# Patient Record
Sex: Female | Born: 1970
Health system: Southern US, Community
[De-identification: ages and names within clinical notes are randomized; demographics above are authoritative.]

## PROBLEM LIST (undated history)

## (undated) DIAGNOSIS — K529 Noninfective gastroenteritis and colitis, unspecified: Secondary | ICD-10-CM

## (undated) DIAGNOSIS — C801 Malignant (primary) neoplasm, unspecified: Secondary | ICD-10-CM

## (undated) HISTORY — PX: OTHER SURGICAL HISTORY: SHX169

---
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
## 2017-03-24 DIAGNOSIS — C801 Malignant (primary) neoplasm, unspecified: Secondary | ICD-10-CM

## 2017-03-24 HISTORY — DX: Malignant (primary) neoplasm, unspecified: C80.1

## 2017-03-24 HISTORY — PX: GASTRECTOMY: SHX58

## 2017-07-27 DIAGNOSIS — B9681 Helicobacter pylori [H. pylori] as the cause of diseases classified elsewhere: Secondary | ICD-10-CM | POA: Insufficient documentation

## 2017-07-27 DIAGNOSIS — Z800 Family history of malignant neoplasm of digestive organs: Secondary | ICD-10-CM | POA: Insufficient documentation

## 2017-07-27 DIAGNOSIS — C169 Malignant neoplasm of stomach, unspecified: Secondary | ICD-10-CM | POA: Insufficient documentation

## 2017-07-27 DIAGNOSIS — K2270 Barrett's esophagus without dysplasia: Secondary | ICD-10-CM | POA: Insufficient documentation

## 2017-07-27 DIAGNOSIS — K2970 Gastritis, unspecified, without bleeding: Secondary | ICD-10-CM | POA: Insufficient documentation

## 2017-07-27 DIAGNOSIS — R1013 Epigastric pain: Secondary | ICD-10-CM | POA: Insufficient documentation

## 2017-08-13 DIAGNOSIS — F17201 Nicotine dependence, unspecified, in remission: Secondary | ICD-10-CM | POA: Insufficient documentation

## 2017-09-08 DIAGNOSIS — D509 Iron deficiency anemia, unspecified: Secondary | ICD-10-CM | POA: Insufficient documentation

## 2017-12-08 DIAGNOSIS — Z85028 Personal history of other malignant neoplasm of stomach: Secondary | ICD-10-CM | POA: Insufficient documentation

## 2017-12-08 DIAGNOSIS — Z08 Encounter for follow-up examination after completed treatment for malignant neoplasm: Secondary | ICD-10-CM | POA: Insufficient documentation

## 2018-01-01 DIAGNOSIS — R636 Underweight: Secondary | ICD-10-CM | POA: Insufficient documentation

## 2018-02-26 DIAGNOSIS — R1084 Generalized abdominal pain: Secondary | ICD-10-CM | POA: Insufficient documentation

## 2018-02-26 DIAGNOSIS — R109 Unspecified abdominal pain: Secondary | ICD-10-CM | POA: Insufficient documentation

## 2018-02-26 DIAGNOSIS — K5900 Constipation, unspecified: Secondary | ICD-10-CM | POA: Insufficient documentation

## 2018-02-26 DIAGNOSIS — R1032 Left lower quadrant pain: Secondary | ICD-10-CM | POA: Insufficient documentation

## 2018-03-02 DIAGNOSIS — N838 Other noninflammatory disorders of ovary, fallopian tube and broad ligament: Secondary | ICD-10-CM | POA: Insufficient documentation

## 2018-05-14 DIAGNOSIS — R1084 Generalized abdominal pain: Secondary | ICD-10-CM | POA: Diagnosis not present

## 2018-05-14 DIAGNOSIS — R935 Abnormal findings on diagnostic imaging of other abdominal regions, including retroperitoneum: Secondary | ICD-10-CM | POA: Diagnosis not present

## 2018-05-14 DIAGNOSIS — N83209 Unspecified ovarian cyst, unspecified side: Secondary | ICD-10-CM | POA: Diagnosis not present

## 2018-05-14 DIAGNOSIS — L659 Nonscarring hair loss, unspecified: Secondary | ICD-10-CM | POA: Diagnosis not present

## 2018-05-14 DIAGNOSIS — L989 Disorder of the skin and subcutaneous tissue, unspecified: Secondary | ICD-10-CM | POA: Diagnosis not present

## 2018-05-14 DIAGNOSIS — K5900 Constipation, unspecified: Secondary | ICD-10-CM | POA: Diagnosis not present

## 2018-05-14 DIAGNOSIS — R110 Nausea: Secondary | ICD-10-CM | POA: Diagnosis not present

## 2018-05-26 DIAGNOSIS — L089 Local infection of the skin and subcutaneous tissue, unspecified: Secondary | ICD-10-CM | POA: Diagnosis not present

## 2018-05-26 DIAGNOSIS — L910 Hypertrophic scar: Secondary | ICD-10-CM | POA: Diagnosis not present

## 2018-05-26 DIAGNOSIS — R6889 Other general symptoms and signs: Secondary | ICD-10-CM | POA: Diagnosis not present

## 2018-05-26 DIAGNOSIS — L720 Epidermal cyst: Secondary | ICD-10-CM | POA: Diagnosis not present

## 2018-05-26 DIAGNOSIS — B001 Herpesviral vesicular dermatitis: Secondary | ICD-10-CM | POA: Diagnosis not present

## 2018-05-27 DIAGNOSIS — D251 Intramural leiomyoma of uterus: Secondary | ICD-10-CM | POA: Diagnosis not present

## 2018-06-07 DIAGNOSIS — I700 Atherosclerosis of aorta: Secondary | ICD-10-CM | POA: Diagnosis not present

## 2018-06-07 DIAGNOSIS — C162 Malignant neoplasm of body of stomach: Secondary | ICD-10-CM | POA: Diagnosis not present

## 2018-06-07 DIAGNOSIS — C169 Malignant neoplasm of stomach, unspecified: Secondary | ICD-10-CM | POA: Diagnosis not present

## 2018-06-07 DIAGNOSIS — R918 Other nonspecific abnormal finding of lung field: Secondary | ICD-10-CM | POA: Diagnosis not present

## 2018-06-08 DIAGNOSIS — C163 Malignant neoplasm of pyloric antrum: Secondary | ICD-10-CM | POA: Diagnosis not present

## 2018-06-08 DIAGNOSIS — Z903 Acquired absence of stomach [part of]: Secondary | ICD-10-CM | POA: Diagnosis not present

## 2018-06-08 DIAGNOSIS — D649 Anemia, unspecified: Secondary | ICD-10-CM | POA: Diagnosis not present

## 2018-06-08 DIAGNOSIS — C162 Malignant neoplasm of body of stomach: Secondary | ICD-10-CM | POA: Diagnosis not present

## 2018-06-08 DIAGNOSIS — Z08 Encounter for follow-up examination after completed treatment for malignant neoplasm: Secondary | ICD-10-CM | POA: Diagnosis not present

## 2018-06-08 DIAGNOSIS — Z85028 Personal history of other malignant neoplasm of stomach: Secondary | ICD-10-CM | POA: Diagnosis not present

## 2018-06-08 DIAGNOSIS — R1032 Left lower quadrant pain: Secondary | ICD-10-CM | POA: Diagnosis not present

## 2018-06-08 DIAGNOSIS — G4700 Insomnia, unspecified: Secondary | ICD-10-CM | POA: Diagnosis not present

## 2018-06-08 MED FILL — TEMAZEPAM 15 MG CAPSULE: 15 | 30 days supply | Qty: 30 | Fill #0

## 2018-06-09 MED FILL — CYANOCOBALAMIN 1,000 MCG/ML: 1000 | 90 days supply | Qty: 3 | Fill #0

## 2018-06-09 MED FILL — AMITRIPTYLINE HCL 25 MG TAB: 25 | 90 days supply | Qty: 90 | Fill #0

## 2018-06-11 DIAGNOSIS — R928 Other abnormal and inconclusive findings on diagnostic imaging of breast: Secondary | ICD-10-CM | POA: Diagnosis not present

## 2018-06-11 MED FILL — BISACODYL 10 MG SUPP: 10 | 12 days supply | Qty: 12 | Fill #0

## 2018-06-16 DIAGNOSIS — F419 Anxiety disorder, unspecified: Secondary | ICD-10-CM | POA: Diagnosis not present

## 2018-06-16 DIAGNOSIS — G4700 Insomnia, unspecified: Secondary | ICD-10-CM | POA: Diagnosis not present

## 2018-06-16 DIAGNOSIS — K5900 Constipation, unspecified: Secondary | ICD-10-CM | POA: Diagnosis not present

## 2018-06-16 DIAGNOSIS — R109 Unspecified abdominal pain: Secondary | ICD-10-CM | POA: Diagnosis not present

## 2018-06-16 MED FILL — PARoxetine HCL 20 MG TABS: 20 | 30 days supply | Qty: 30 | Fill #0

## 2018-06-16 MED FILL — BD LUER-LOK SYR 3 ML 25GX5/: 25G X 5/8" | 365 days supply | Qty: 12 | Fill #0

## 2018-07-10 MED FILL — PARoxetine HCL 20 MG TABS: 20 | 30 days supply | Qty: 30 | Fill #1

## 2018-08-04 DIAGNOSIS — G4700 Insomnia, unspecified: Secondary | ICD-10-CM | POA: Diagnosis not present

## 2018-08-04 DIAGNOSIS — F419 Anxiety disorder, unspecified: Secondary | ICD-10-CM | POA: Diagnosis not present

## 2018-08-04 DIAGNOSIS — K5900 Constipation, unspecified: Secondary | ICD-10-CM | POA: Diagnosis not present

## 2018-08-04 MED FILL — TEMAZEPAM 15 MG CAPSULE: 15 | 30 days supply | Qty: 30 | Fill #0

## 2018-08-04 MED FILL — PARoxetine HCL 40 MG TABS: 40 | 30 days supply | Qty: 30 | Fill #0

## 2018-08-06 MED FILL — BISACODYL 10 MG SUPP: 10 | 12 days supply | Qty: 12 | Fill #0

## 2018-08-10 MED FILL — CLINDAMYCIN HCL 300 MG CAPS: 300 | 10 days supply | Qty: 30 | Fill #0

## 2018-08-12 DIAGNOSIS — L03012 Cellulitis of left finger: Secondary | ICD-10-CM | POA: Diagnosis not present

## 2018-08-12 DIAGNOSIS — E538 Deficiency of other specified B group vitamins: Secondary | ICD-10-CM | POA: Diagnosis not present

## 2018-08-12 DIAGNOSIS — Z23 Encounter for immunization: Secondary | ICD-10-CM | POA: Diagnosis not present

## 2018-08-12 MED FILL — MUPIROCIN 2% OINTMENT: 2 | 7 days supply | Qty: 22 | Fill #0

## 2018-08-25 DIAGNOSIS — Z85028 Personal history of other malignant neoplasm of stomach: Secondary | ICD-10-CM | POA: Diagnosis not present

## 2018-08-25 DIAGNOSIS — R112 Nausea with vomiting, unspecified: Secondary | ICD-10-CM | POA: Diagnosis not present

## 2018-08-25 DIAGNOSIS — R1084 Generalized abdominal pain: Secondary | ICD-10-CM | POA: Diagnosis not present

## 2018-08-25 DIAGNOSIS — D127 Benign neoplasm of rectosigmoid junction: Secondary | ICD-10-CM | POA: Diagnosis not present

## 2018-08-25 DIAGNOSIS — K9171 Accidental puncture and laceration of a digestive system organ or structure during a digestive system procedure: Secondary | ICD-10-CM | POA: Diagnosis not present

## 2018-08-25 DIAGNOSIS — K3189 Other diseases of stomach and duodenum: Secondary | ICD-10-CM | POA: Diagnosis not present

## 2018-08-25 DIAGNOSIS — K529 Noninfective gastroenteritis and colitis, unspecified: Secondary | ICD-10-CM | POA: Diagnosis not present

## 2018-08-25 DIAGNOSIS — K222 Esophageal obstruction: Secondary | ICD-10-CM | POA: Diagnosis not present

## 2018-08-25 DIAGNOSIS — Z1211 Encounter for screening for malignant neoplasm of colon: Secondary | ICD-10-CM | POA: Diagnosis not present

## 2018-08-25 DIAGNOSIS — K648 Other hemorrhoids: Secondary | ICD-10-CM | POA: Diagnosis not present

## 2018-08-25 DIAGNOSIS — R634 Abnormal weight loss: Secondary | ICD-10-CM | POA: Diagnosis not present

## 2018-09-03 MED FILL — PARoxetine HCL 40 MG TABS: 40 | 30 days supply | Qty: 30 | Fill #1

## 2018-09-03 MED FILL — TEMAZEPAM 15 MG CAPSULE: 15 | 30 days supply | Qty: 30 | Fill #1

## 2018-09-17 ENCOUNTER — Other Ambulatory Visit: Payer: Self-pay

## 2018-09-17 ENCOUNTER — Ambulatory Visit: Payer: Self-pay | Admitting: Family Medicine

## 2018-09-17 DIAGNOSIS — B351 Tinea unguium: Secondary | ICD-10-CM | POA: Diagnosis not present

## 2018-09-17 DIAGNOSIS — L089 Local infection of the skin and subcutaneous tissue, unspecified: Secondary | ICD-10-CM | POA: Diagnosis not present

## 2018-09-17 DIAGNOSIS — L910 Hypertrophic scar: Secondary | ICD-10-CM | POA: Diagnosis not present

## 2018-09-17 DIAGNOSIS — B353 Tinea pedis: Secondary | ICD-10-CM | POA: Diagnosis not present

## 2018-09-17 DIAGNOSIS — L219 Seborrheic dermatitis, unspecified: Secondary | ICD-10-CM | POA: Diagnosis not present

## 2018-09-17 MED FILL — HYDROCORTISONE 2.5% CREAM: 2.5 | 10 days supply | Qty: 30 | Fill #0

## 2018-09-17 MED FILL — TERBINAFINE HCL 250 MG TAB: 250 | 90 days supply | Qty: 90 | Fill #0

## 2018-09-17 MED FILL — SM ATHLETE'S 1% FOOT CREAM: 1 % | 14 days supply | Qty: 30 | Fill #0

## 2018-09-17 MED FILL — KETOCONAZOLE 2% SHAMPOO: 2 | 30 days supply | Qty: 120 | Fill #0

## 2018-09-20 ENCOUNTER — Encounter (HOSPITAL_COMMUNITY): Payer: Self-pay

## 2018-09-20 ENCOUNTER — Other Ambulatory Visit: Payer: Self-pay

## 2018-09-20 ENCOUNTER — Emergency Department (HOSPITAL_COMMUNITY)
Admission: EM | Admit: 2018-09-20 | Discharge: 2018-09-21 | Disposition: A | Discharge status: Home / Self Care | Payer: 59 | Attending: Emergency Medicine | Admitting: Emergency Medicine

## 2018-09-20 DIAGNOSIS — R109 Unspecified abdominal pain: Secondary | ICD-10-CM | POA: Diagnosis not present

## 2018-09-20 DIAGNOSIS — F17210 Nicotine dependence, cigarettes, uncomplicated: Secondary | ICD-10-CM | POA: Diagnosis not present

## 2018-09-20 DIAGNOSIS — R1084 Generalized abdominal pain: Secondary | ICD-10-CM | POA: Diagnosis not present

## 2018-09-20 DIAGNOSIS — Z79899 Other long term (current) drug therapy: Secondary | ICD-10-CM | POA: Insufficient documentation

## 2018-09-20 DIAGNOSIS — K5900 Constipation, unspecified: Secondary | ICD-10-CM | POA: Insufficient documentation

## 2018-09-20 DIAGNOSIS — Z85028 Personal history of other malignant neoplasm of stomach: Secondary | ICD-10-CM | POA: Diagnosis not present

## 2018-09-20 HISTORY — DX: Noninfective gastroenteritis and colitis, unspecified: K52.9

## 2018-09-20 NOTE — ED Notes (Signed)
Bed: SN05 Expected date:  Expected time:  Means of arrival:  Comments: [place]

## 2018-09-20 NOTE — ED Triage Notes (Addendum)
Pt c/o abd pain that began 3 days ago, however, today around 1700 it got worse. Pt describes it as a burning sensation and pain in epigastric region and LLQ. Husband at bedside states that she is 71mo out from complete stomach removal d/t stomach cancer.  Pt has experienced increased weakness over the past several days. Pt cannot stand, only sit in the chair, and crawling on floor to get around. Pt was only able to eat breakfast d/t nausea, small amount of emesis.   Pt has chronic constipation.

## 2018-09-21 ENCOUNTER — Emergency Department (HOSPITAL_COMMUNITY): Payer: 59

## 2018-09-21 ENCOUNTER — Encounter (HOSPITAL_COMMUNITY): Payer: Self-pay

## 2018-09-21 DIAGNOSIS — Z85028 Personal history of other malignant neoplasm of stomach: Secondary | ICD-10-CM | POA: Diagnosis not present

## 2018-09-21 DIAGNOSIS — Z79899 Other long term (current) drug therapy: Secondary | ICD-10-CM | POA: Diagnosis not present

## 2018-09-21 DIAGNOSIS — F17210 Nicotine dependence, cigarettes, uncomplicated: Secondary | ICD-10-CM | POA: Diagnosis not present

## 2018-09-21 DIAGNOSIS — R109 Unspecified abdominal pain: Secondary | ICD-10-CM | POA: Diagnosis not present

## 2018-09-21 DIAGNOSIS — K5900 Constipation, unspecified: Secondary | ICD-10-CM | POA: Diagnosis not present

## 2018-09-21 LAB — CBC WITH DIFFERENTIAL/PLATELET
Abs Immature Granulocytes: 0.02 10*3/uL (ref 0.00–0.07)
Basophils Absolute: 0.1 10*3/uL (ref 0.0–0.1)
Basophils Relative: 1 %
Eosinophils Absolute: 0.2 10*3/uL (ref 0.0–0.5)
Eosinophils Relative: 2 %
HCT: 41.7 % (ref 36.0–46.0)
Hemoglobin: 13.5 g/dL (ref 12.0–15.0)
Immature Granulocytes: 0 %
Lymphocytes Relative: 14 %
Lymphs Abs: 1.6 10*3/uL (ref 0.7–4.0)
MCH: 31.2 pg (ref 26.0–34.0)
MCHC: 32.4 g/dL (ref 30.0–36.0)
MCV: 96.3 fL (ref 80.0–100.0)
Monocytes Absolute: 0.5 10*3/uL (ref 0.1–1.0)
Monocytes Relative: 5 %
Neutro Abs: 8.6 10*3/uL — ABNORMAL HIGH (ref 1.7–7.7)
Neutrophils Relative %: 78 %
Platelets: 352 10*3/uL (ref 150–400)
RBC: 4.33 MIL/uL (ref 3.87–5.11)
RDW: 13.2 % (ref 11.5–15.5)
WBC: 11 10*3/uL — ABNORMAL HIGH (ref 4.0–10.5)
nRBC: 0 % (ref 0.0–0.2)

## 2018-09-21 LAB — COMPREHENSIVE METABOLIC PANEL
ALT: 46 U/L — ABNORMAL HIGH (ref 0–44)
AST: 33 U/L (ref 15–41)
Albumin: 4.6 g/dL (ref 3.5–5.0)
Alkaline Phosphatase: 89 U/L (ref 38–126)
Anion gap: 8 (ref 5–15)
BUN: 13 mg/dL (ref 6–20)
CO2: 25 mmol/L (ref 22–32)
Calcium: 9.1 mg/dL (ref 8.9–10.3)
Chloride: 105 mmol/L (ref 98–111)
Creatinine, Ser: 0.75 mg/dL (ref 0.44–1.00)
GFR calc Af Amer: >60 mL/min (ref >60)
GFR calc non Af Amer: >60 mL/min (ref >60)
Glucose, Bld: 91 mg/dL (ref 70–99)
Potassium: 3.9 mmol/L (ref 3.5–5.1)
Sodium: 138 mmol/L (ref 135–145)
Total Bilirubin: 0.4 mg/dL (ref 0.3–1.2)
Total Protein: 7.7 g/dL (ref 6.5–8.1)

## 2018-09-21 LAB — LIPASE, BLOOD: Lipase: 24 U/L (ref 11–51)

## 2018-09-21 LAB — POC URINE PREG, ED: Preg Test, Ur: NEGATIVE

## 2018-09-21 MED ORDER — KETOROLAC TROMETHAMINE 15 MG/ML IJ SOLN
15.0000 mg | Freq: Once | INTRAMUSCULAR | Status: AC
  Administered 2018-09-21: 15 mg via INTRAVENOUS
  Filled 2018-09-21: qty 1

## 2018-09-21 MED ORDER — SODIUM CHLORIDE (PF) 0.9 % IJ SOLN
INTRAMUSCULAR | Status: AC
  Filled 2018-09-21: qty 50

## 2018-09-21 MED ORDER — SODIUM CHLORIDE 0.9 % IV BOLUS
1000.0000 mL | Freq: Once | INTRAVENOUS | Status: AC
  Administered 2018-09-21: 1000 mL via INTRAVENOUS

## 2018-09-21 MED ORDER — IOHEXOL 300 MG/ML  SOLN
100.0000 mL | Freq: Once | INTRAMUSCULAR | Status: AC | PRN
  Administered 2018-09-21: 80 mL via INTRAVENOUS

## 2018-09-21 MED ORDER — IOHEXOL 300 MG/ML  SOLN
30.0000 mL | Freq: Once | INTRAMUSCULAR | Status: AC | PRN
  Administered 2018-09-21: 30 mL via ORAL

## 2018-09-21 MED ORDER — ALUM & MAG HYDROXIDE-SIMETH 200-200-20 MG/5ML PO SUSP
30.0000 mL | Freq: Once | ORAL | Status: AC
  Administered 2018-09-21: 30 mL via ORAL
  Filled 2018-09-21: qty 30

## 2018-09-21 MED ORDER — ONDANSETRON HCL 4 MG/2ML IJ SOLN
4.0000 mg | Freq: Once | INTRAMUSCULAR | Status: AC
  Administered 2018-09-21: 4 mg via INTRAVENOUS
  Filled 2018-09-21: qty 2

## 2018-09-21 MED ORDER — FENTANYL CITRATE (PF) 100 MCG/2ML IJ SOLN
50.0000 ug | Freq: Once | INTRAMUSCULAR | Status: AC
  Administered 2018-09-21: 01:00:00 50 ug via INTRAVENOUS
  Filled 2018-09-21: qty 2

## 2018-09-21 NOTE — ED Provider Notes (Signed)
[facility] DEPT Provider Note  CSN: [id number] Arrival date & time: 09/20/18 2251  Chief Complaint(s) Abdominal Pain  HPI Brooke Austin is a 48 y.o. 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  Pain is worse with movement and palpation.  No alleviating factors.  She is endorsing some nausea and nonbloody nonbilious emesis.  Denies diarrhea.  Reports chronic constipation currently on MiraLAX.  Reports that she has not had a good bowel movement in several days.  Denies any urinary symptoms.  HPI  Past Medical History Past Medical History:  Diagnosis Date   Cancer ([place]) 2019   Gastric Stage 1A   Colitis    There are no active problems to display for this patient.  Home Medication(s) Prior to Admission medications   Medication Sig Start Date End Date Taking? Authorizing Provider  acetaminophen (TYLENOL) 500 MG tablet Take 1,000 mg by mouth every 6 (six) hours as needed for mild pain, moderate pain or headache.   Yes [provider]  bisacodyl (DULCOLAX) 10 MG suppository Place 10 mg rectally daily as needed for mild constipation or moderate constipation.  08/06/18  Yes [provider]  cholecalciferol (VITAMIN D3) 25 MCG (1000 UT) tablet Take 1,000 Units by mouth daily.   Yes [provider]  cyanocobalamin (,VITAMIN B-12,) 1000 MCG/ML injection Inject 1 mL into the muscle every 30 (thirty) days. 06/09/18  Yes [provider]  Omega-3 1000 MG CAPS Take 1,000 mg by mouth daily.   Yes [provider]  PARoxetine (PAXIL) 40 MG tablet Take 40 mg by mouth daily. 09/02/18  Yes [provider]  polyethylene glycol (MIRALAX / GLYCOLAX) 17 g packet Take 17 g  by mouth 2 (two) times a day.   Yes [provider]  temazepam (RESTORIL) 15 MG capsule Take 15 mg by mouth at bedtime. 09/02/18  Yes [provider]                                                                                                                                    Past Surgical History Past Surgical History:  Procedure Laterality Date   GASTRECTOMY  2019   Family History No family history on file.  Social History Social History   Tobacco Use   Smoking status: Current Every Day Smoker    Packs/day: 1.00   Smokeless tobacco: Never Used  Substance Use Topics   Alcohol use: Not Currently   Drug use: Not Currently   Allergies Patient has no known allergies.  Review of Systems Review of Systems All other systems are reviewed and are negative for acute change except as noted in the HPI  Physical Exam  Vital Signs  I have reviewed the triage vital signs BP 114/82    Pulse (!) 57    Resp 18    Ht 5\' 8"  (1.727 m)    Wt 48.5 kg    SpO2 98%    BMI 16.26 kg/m   Physical Exam Vitals signs reviewed.  Constitutional:      General: She is not in acute distress.    Appearance: She is well-developed and underweight. She is not diaphoretic.  HENT:     Head: Normocephalic and atraumatic.     Right Ear: External ear normal.     Left Ear: External ear normal.     Nose: Nose normal.  Eyes:     General: No scleral icterus.    Conjunctiva/sclera: Conjunctivae normal.  Neck:     Musculoskeletal: Normal range of motion.     Trachea: Phonation normal.  Cardiovascular:     Rate and Rhythm: Normal rate and regular rhythm.  Pulmonary:     Effort: Pulmonary effort is normal. No respiratory distress.     Breath sounds: No stridor.  Abdominal:     General: There is no distension.     Tenderness: There is generalized abdominal tenderness. There is no guarding or rebound.     Comments: Numerous abdominal scars  Musculoskeletal: Normal range of motion.    Neurological:     Mental Status: She is alert and oriented to person, place, and time.  Psychiatric:        Behavior: Behavior normal.     ED Results and Treatments Labs (all labs ordered are listed, but only abnormal results are displayed) Labs Reviewed  CBC WITH DIFFERENTIAL/PLATELET - Abnormal; Notable for the following components:      Result Value   WBC 11.0 (*)    Neutro Abs 8.6 (*)    All other components within normal limits  COMPREHENSIVE METABOLIC PANEL - Abnormal; Notable for the following components:   ALT 46 (*)    All other components within normal limits  LIPASE, BLOOD  POC URINE PREG, ED                                                                                                                         EKG  EKG Interpretation  Date/Time:    Ventricular Rate:    PR Interval:    QRS Duration:   QT Interval:    QTC Calculation:   R Axis:     Text Interpretation:        Radiology Ct Abdomen Pelvis W Contrast  Result Date: 09/21/2018 CLINICAL DATA:  Abdominal pain EXAM: CT ABDOMEN AND PELVIS WITH CONTRAST TECHNIQUE: Multidetector CT imaging of the abdomen and pelvis was performed using the standard protocol following bolus administration of intravenous contrast. CONTRAST:  51mL OMNIPAQUE IOHEXOL 300 MG/ML  SOLN COMPARISON:  06/07/2018 FINDINGS: Lower chest: Lung bases are clear. No effusions. Heart is normal size. Hepatobiliary: No focal hepatic abnormality. Gallbladder unremarkable. Pancreas: No focal abnormality  or ductal dilatation. Spleen: No focal abnormality.  Normal size. Adrenals/Urinary Tract: No adrenal abnormality. No focal renal abnormality. No stones or hydronephrosis. Urinary bladder is unremarkable. Stomach/Bowel: Stomach, large and small bowel grossly unremarkable. Large stool burden throughout the colon. No evidence of bowel obstruction. Appendix not definitively seen. Vascular/Lymphatic: No evidence of aneurysm or adenopathy. Reproductive:  Uterus and adnexa unremarkable.  No mass. Other: No free fluid or free air. Musculoskeletal: No acute bony abnormality. IMPRESSION: Moderate stool burden throughout the colon. No evidence of bowel obstruction. Nonvisualization of the appendix. No pericecal inflammatory process. Electronically Signed   By: Rolm Baptise M.D.   On: 09/21/2018 03:56    Pertinent labs & imaging results that were available during my care of the patient were reviewed by me and considered in my medical decision making (see chart for details).  Medications Ordered in ED Medications  sodium chloride (PF) 0.9 % injection (has no administration in time range)  sodium chloride 0.9 % bolus 1,000 mL (0 mLs Intravenous Stopped 09/21/18 0200)  ondansetron (ZOFRAN) injection 4 mg (4 mg Intravenous Given 09/21/18 0058)  alum & mag hydroxide-simeth (MAALOX/MYLANTA) 200-200-20 MG/5ML suspension 30 mL (30 mLs Oral Given 09/21/18 0104)  fentaNYL (SUBLIMAZE) injection 50 mcg (50 mcg Intravenous Given 09/21/18 0059)  iohexol (OMNIPAQUE) 300 MG/ML solution 30 mL (30 mLs Oral Contrast Given 09/21/18 0256)  iohexol (OMNIPAQUE) 300 MG/ML solution 100 mL (80 mLs Intravenous Contrast Given 09/21/18 0339)  ketorolac (TORADOL) 15 MG/ML injection 15 mg (15 mg Intravenous Given 09/21/18 0458)                                                                                                                                    Procedures Procedures  (including critical care time)  Medical Decision Making / ED Course I have reviewed the nursing notes for this encounter and the patient's prior records (if available in EHR or on provided paperwork).  Labs with mild leukocytosis, but otherwise grossly reassuring without anemia, significant electrolyte derangements or renal insufficiency.  UPT negative.  No evidence of bili obstruction or pancreatitis.  CT of the abdomen without intra-abdominal inflammatory/infectious process or bowel obstruction.  It did  reveal moderate stool burden likely causing the patient's pain.  Recommended increase dosing of MiraLAX.     Teegan Guinther was evaluated in Emergency Department on 09/21/2018 for the symptoms described in the history of present illness. She was evaluated in the context of the global COVID-19 pandemic, which necessitated consideration that the patient might be at risk for infection with the SARS-CoV-2 virus that causes COVID-19. Institutional protocols and algorithms that pertain to the evaluation of patients at risk for COVID-19 are in a state of rapid change based on information released by regulatory bodies including the CDC and federal and state organizations. These policies and algorithms were followed during the patient's care in the ED.  Final Clinical Impression(s) / ED Diagnoses Final diagnoses:  Generalized abdominal pain  Constipation, unspecified constipation type    The patient appears reasonably screened and/or stabilized for discharge and I doubt any other medical condition or other [facility] requiring further screening, evaluation, or treatment in the ED at this time prior to discharge.  Disposition: Discharge  Condition: Good  I have discussed the results, Dx and Tx plan with the patient who expressed understanding and agree(s) with the plan. Discharge instructions discussed at great length. The patient was given strict return precautions who verbalized understanding of the instructions. No further questions at time of discharge.    ED Discharge Orders    None       Follow Up: Girtha Rm, NP-C [place] [id number] [phone]  Schedule an appointment as soon as possible for a visit  As needed      This chart was dictated using voice recognition software.  Despite best efforts to proofread,  errors can occur which can change the documentation meaning.   Fatima Blank, MD 09/21/18 [phone]

## 2018-09-21 NOTE — Discharge Instructions (Signed)
Please take 6 capfuls of MiraLAX in a 32 oz bottle of Gatorade over 2-4 hour period. The following day take 3 capfuls. On day 3 start taking 1 capful 3 times a day. Slowly cut back as needed until you have normal bowel movements. 

## 2018-09-21 NOTE — ED Notes (Addendum)
Pt ambulated to bathroom with a steady gait and no assistance needed.

## 2018-09-21 NOTE — ED Notes (Signed)
Pt transported to CT ?

## 2018-09-22 ENCOUNTER — Ambulatory Visit: Payer: 59 | Admitting: Family Medicine

## 2018-09-22 ENCOUNTER — Encounter: Payer: Self-pay | Admitting: Family Medicine

## 2018-09-22 ENCOUNTER — Other Ambulatory Visit: Payer: Self-pay

## 2018-09-22 VITALS — BP 120/80 | HR 85 | Temp 97.7°F | Wt 100.2 lb

## 2018-09-22 DIAGNOSIS — F17201 Nicotine dependence, unspecified, in remission: Secondary | ICD-10-CM | POA: Diagnosis not present

## 2018-09-22 DIAGNOSIS — D509 Iron deficiency anemia, unspecified: Secondary | ICD-10-CM

## 2018-09-22 DIAGNOSIS — G4700 Insomnia, unspecified: Secondary | ICD-10-CM | POA: Diagnosis not present

## 2018-09-22 DIAGNOSIS — Z7689 Persons encountering health services in other specified circumstances: Secondary | ICD-10-CM

## 2018-09-22 DIAGNOSIS — Z85028 Personal history of other malignant neoplasm of stomach: Secondary | ICD-10-CM

## 2018-09-22 DIAGNOSIS — F329 Major depressive disorder, single episode, unspecified: Secondary | ICD-10-CM

## 2018-09-22 DIAGNOSIS — N838 Other noninflammatory disorders of ovary, fallopian tube and broad ligament: Secondary | ICD-10-CM

## 2018-09-22 DIAGNOSIS — R636 Underweight: Secondary | ICD-10-CM | POA: Diagnosis not present

## 2018-09-22 DIAGNOSIS — M545 Low back pain, unspecified: Secondary | ICD-10-CM | POA: Insufficient documentation

## 2018-09-22 DIAGNOSIS — K5900 Constipation, unspecified: Secondary | ICD-10-CM | POA: Diagnosis not present

## 2018-09-22 DIAGNOSIS — Z800 Family history of malignant neoplasm of digestive organs: Secondary | ICD-10-CM | POA: Diagnosis not present

## 2018-09-22 DIAGNOSIS — F419 Anxiety disorder, unspecified: Secondary | ICD-10-CM

## 2018-09-22 DIAGNOSIS — Z9181 History of falling: Secondary | ICD-10-CM

## 2018-09-22 DIAGNOSIS — F32A Depression, unspecified: Secondary | ICD-10-CM

## 2018-09-22 NOTE — Patient Instructions (Addendum)
You can call to schedule your appointment with the psychiatrist/counselor. A few offices are listed below for you to call.    [facility]  (across from [facility])  [facility] [address] [place], [place] [address] [address] P[address]A  [place], [place], [place] [address]  Phone: [phone]   [place], [place] [address]  Phone: [phone]  You should receive a call very soon from [facility] (orthopedist) regarding your back pain.   You will receive a call from [facility].   You will receive a call from [facility].   You will receive a call from [facility]  Follow up with me in 4-6 weeks.

## 2018-09-22 NOTE — Progress Notes (Signed)
Subjective:    Patient ID: Brooke Austin, female    DOB: January 03, 1971, 48 y.o.   MRN: [id number]  HPI Chief Complaint  Patient presents with  . get established    get established, back pain, falling alot, brusied on foot, thigh, knee   She is new to the practice and here to establish care.  From [place] originally but has lived in the [place] for 20 years.   Previous medical care: [facility] providers. She is switching to [facility] due to her husband being a new hospitalist for [facility].  She has a complex medical history and has had several specialists in [facility] including GI and oncologist for gastric cancer, OB/GYN, and dermatologist. She will need new referral to these specialists in [place].   States her main concern is her low back pain. Pain has been constant, gradually worsening. Non radiating. States pain is worse with sitting and walking.  States she is falling. [place] 3 times this month due to generalized leg weakness and low back pain.  States she has bruises on her left hip, left knee and left foot from a recent fall.   States she was referred to an orthopedist for back pain but her insurance switched and now she needs a new referral.   States her mother passed away last year with gastric cancer. Patient was then diagnosed a month later with gastric cancer. She had a gastrectomy. No chemo or radiation.    States she was told she has a mass on her ovary.   She was seen in the [facility] ED yesterday for constipation and states she is getting relief. Using Miralax.   States she has lost 40 lbs since May 2019 due to cancer and removal of stomach.   Taking Paxil for depression for the past 3 months.  Restoril for sleep. She is tearful talking about her life over the past year and has not been involved in counseling. She is willing to see someone.  She is eating more now. Appetite has improved.   Smoker- 1 cig per day. 30 years.  Stopped several times.   Social history: Lives with her  husband, worked in the past as Cabin crew Denies drinking alcohol, drug use   Last Menstrual cycle: 09/08/18  Denies fever, chills, dizziness, chest pain, palpitations, shortness of breath, urinary symptoms, LE edema.   Reviewed allergies, medications, past medical, surgical, family, and social history.   Review of Systems Pertinent positives and negatives in the history of present illness.     Objective:   Physical Exam Constitutional:      Appearance: She is underweight.  Eyes:     General: Lids are normal.     Conjunctiva/sclera: Conjunctivae normal.  Cardiovascular:     Rate and Rhythm: Normal rate and regular rhythm.     Pulses: Normal pulses.     Heart sounds: Normal heart sounds.  Pulmonary:     Effort: Pulmonary effort is normal.     Breath sounds: Normal breath sounds.  Musculoskeletal:     Lumbar back: She exhibits tenderness and pain.     Comments: Diffusely tender to her lower sacral area, mid line and paraspinal.   Skin:    General: Skin is warm and dry.  Neurological:     General: No focal deficit present.     Mental Status: She is alert.    BP 120/80   Pulse 85   Temp 97.7 F (36.5 C) (Oral)   Wt 100 lb 3.2 oz (  45.5 kg)   LMP 09/08/2018   SpO2 97%   BMI 15.24 kg/m       Assessment & Plan:  Acute midline low back pain without sciatica - Plan: Ambulatory referral to Orthopedic Surgery. She will continue with Tylenol and heat or ice for now. Discussed option of PT. She would like to wait until she sees ortho. May need balance PT since she is falling.   Underweight - appetite is improving per patient.   Encounter to establish care   Ovarian mass, left - Plan: Ambulatory referral to Gynecology, Ambulatory referral to Oncology  Iron deficiency anemia, unspecified iron deficiency anemia type - Plan: Ambulatory referral to Oncology   Constipation, unspecified constipation type - Plan: Ambulatory referral to Gastroenterology. Continue taking Miralax   Tobacco abuse, in remission - Plan: advised her to stop smoking even though she reports only smoking 1 cigarette per day.   Family history of gastric cancer - Plan: Ambulatory referral to Oncology  Insomnia, unspecified type - Plan: continue Restoril. List of counselors provided.   History of gastric cancer - Plan: Ambulatory referral to Gastroenterology, Ambulatory referral to Oncology   Patient had 2 or more falls in past year - Plan: Ambulatory referral to Orthopedic Surgery. May need PT. No obvious neurological deficits today. She is fragile appearing.   Anxiety and depression - Plan: continue Paxil and seek counseling.

## 2018-09-23 ENCOUNTER — Ambulatory Visit (INDEPENDENT_AMBULATORY_CARE_PROVIDER_SITE_OTHER): Payer: Commercial Managed Care - PPO | Admitting: Family Medicine

## 2018-09-23 ENCOUNTER — Ambulatory Visit: Payer: Self-pay

## 2018-09-23 ENCOUNTER — Telehealth: Payer: Self-pay | Admitting: Hematology

## 2018-09-23 ENCOUNTER — Encounter: Payer: Self-pay | Admitting: Family Medicine

## 2018-09-23 DIAGNOSIS — Z85028 Personal history of other malignant neoplasm of stomach: Secondary | ICD-10-CM | POA: Diagnosis not present

## 2018-09-23 DIAGNOSIS — M5441 Lumbago with sciatica, right side: Secondary | ICD-10-CM

## 2018-09-23 DIAGNOSIS — M5442 Lumbago with sciatica, left side: Secondary | ICD-10-CM

## 2018-09-23 DIAGNOSIS — G8929 Other chronic pain: Secondary | ICD-10-CM | POA: Diagnosis not present

## 2018-09-23 MED ORDER — TIZANIDINE HCL 2 MG PO TABS: 2.0000 mg | ORAL_TABLET | Freq: Four times a day (QID) | ORAL | 1 refills | Status: DC | PRN

## 2018-09-23 MED FILL — tiZANidine HCL 2 MG TABS: 2 | 8 days supply | Qty: 60 | Fill #0

## 2018-09-23 NOTE — Telephone Encounter (Signed)
Received a new patient referral from [facility] for hx of stomach cancer. Pt has been cld and scheduled to see Dr. Burr Medico on 7/13 at 230pm. Mrs. Takeda is aware to arrive 20 minutes early.

## 2018-09-23 NOTE — Progress Notes (Signed)
Office Visit Note   Patient: Brooke Austin           Date of Birth: 09-24-1970           MRN: [id number] Visit Date: 09/23/2018 Requested by: Girtha Rm, NP-C [place],  [place] [address] PCP: Girtha Rm, NP-C  Subjective: Chief Complaint  Patient presents with  . Lower Back - Pain    Has had pain for years but it is getting worse over the last few months. Radiates down the left leg. Occasional numbness and tingling in the left foot. Has been falling - she attributes this to the back pain.    HPI: She is here with low back pain.  She states that she has had back problems since childhood, but over the past few months it has gotten substantially worse.  She gets pain radiating into the posterior hips and down both legs, especially the left.  She has had some numbness and tingling in her left foot and she has fallen a couple times because of her pain.  Denies any bowel or bladder dysfunction, fevers or chills.  She does have a history of gastric cancer treated with gastrectomy last year.  She had a CT scan of her abdomen and pelvis showing no sign of musculoskeletal issues per radiology report.  She has not had any images of her lumbar spine.  She is not taking anything specifically for this pain.              ROS:   All other systems were reviewed and are negative.  Objective: Vital Signs: LMP 09/08/2018   Physical Exam:  General:  Alert and oriented, in no acute distress. Pulm:  Breathing unlabored. Psy:  Normal mood, congruent affect. Skin: No visible rash or bruising on the lower back but she does have a bruise on the lateral left thigh. Low back: No tenderness over the lumbar spinous processes or the SI joint.  Extremely tender in the sciatic notch of both posterior hips.  Straight leg raise equivocal, the lower extremity strength and reflexes are still normal.  Imaging: None today.  Assessment & Plan: 1.  Low back pain with bilateral sciatica  symptoms, history of gastric cancer last year. -We will proceed with x-rays and MRI with IV contrast lumbar spine.  Depending on the results, we might try discal therapy. -Zanaflex to take as needed.    Procedures: No procedures performed  No notes on file     PMFS History: Patient Active Problem List   Diagnosis Date Noted  . History of gastric cancer 09/22/2018  . Patient had 2 or more falls in past year 09/22/2018  . Acute midline low back pain without sciatica 09/22/2018  . Anxiety and depression 09/22/2018  . Anxiety 08/04/2018  . Insomnia 08/04/2018  . Ovarian mass, left 03/02/2018  . Abdominal pain 02/26/2018  . Constipation 02/26/2018  . Underweight 01/01/2018  . Iron deficiency anemia 09/08/2017  . 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 Barrett's esophagus without dysplasia 07/27/2017  . Family history of gastric cancer 07/27/2017  . Malignant neoplasm of stomach ([place]) 07/27/2017  . Helicobacter pylori gastritis 07/27/2017   Past Medical History:  Diagnosis Date  . Cancer ([place]) 2019   Gastric Stage 1A  . Colitis     Family History  Problem Relation Age of Onset  . Stomach cancer Mother        died at 73    Past Surgical History:  Procedure  Laterality Date  . breast surgery    . GASTRECTOMY  2019   Social History   Occupational History  . Not on file  Tobacco Use  . Smoking status: Current Every Day Smoker    Packs/day: 1.00  . Smokeless tobacco: Never Used  Substance and Sexual Activity  . Alcohol use: Not Currently  . Drug use: Not Currently  . Sexual activity: Yes

## 2018-09-23 NOTE — Addendum Note (Signed)
Addended by: Hortencia Pilar on: 09/23/2018 10:50 AM   Modules accepted: Orders

## 2018-09-30 NOTE — Progress Notes (Signed)
Brooke Austin   Telephone:(336) [phone] Fax:([phone]) [place] Note   Patient Care Team: Girtha Rm, NP-C as PCP - General (Family Medicine)  Date of Service:  10/04/2018   CHIEF COMPLAINTS/PURPOSE OF CONSULTATION:  History of gastric Cancer   REFERRING PHYSICIAN:  PCP Harland Dingwall NP-C  Oncology History Overview Note  Cancer Staging Malignant neoplasm of stomach [facility]) Staging form: Stomach, AJCC 8th Edition - Pathologic stage from 10/04/2018: Stage IA (pT1a, pN0, cM0) - Signed by Truitt Merle, MD on 10/04/2018    Malignant neoplasm of stomach ([place])  07/23/2017 Initial Biopsy   07/23/17 EGD revealed malignant gastric ulcer. 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   07/27/2017 Initial Diagnosis   Malignant neoplasm of stomach ([place])   07/28/2017 Imaging   CT CAP W Contrast 07/28/17 [facility] IMPRESSION: 1. The primary gastric lesion is not readily apparent on this study. 2. Borderline enlarged gastrohepatic ligament node measures 1 cm. Additionally there is a ovoid soft tissue attenuating nodule within the left periaortic retroperitoneum which is indeterminate. Periaortic adenopathy cannot be excluded. Consider further evaluation with PET-CT. 3. Tiny nonspecific nodule in the left upper lobe measures 4 mm.   07/30/2017 PET scan   PET 07/30/17 [facility] IMPRESSION: 1. No hypermetabolic mass or adenopathy to suggest metastatic disease. 2. Low level FDG uptake associated with the periaortic left retroperitoneal nodule compatible with a benign etiology. 3. 4 mm left upper lobe lung nodule is too small to characterize by PET-CT.   08/19/2017 Surgery   Total Gastrectomy 08/19/2017 at [facility]   08/19/2017 Pathology Results   FINAL PATHOLOGIC DIAGNOSIS from [facility] MICROSCOPIC EXAMINATION AND DIAGNOSIS  A.  STOMACH, TOTAL GASTRECTOMY:      Poorly differentiated signet-ring cell carcinoma  forming multiple microscopic masses over a 2.0 x 0.7 cm area in the antrum. The tumor invades into the lamina propria. Angiolymphatic invasion is not identified. Perineural invasion is not identified.       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       Glandular mucosa with intestinal metaplasia in the distal esophagus consistent with specialized Barrett's mucosa; negative for dysplasia.       Multiple (49) perigastric lymph nodes are negative for tumor (0/49).       Squamous esophageal mucosa at the proximal surgical margin; negative for tumor.       Duodenal mucosa at the distal surgical margin; negative for tumor,       All surgical margins are negative for tumor (minimum tumor free margin, 7.0 cm, distal surgical margin).  B.  LYMPH NODES, HEPATIC ARTERY, EXCISION:      Multiple (8) hepatic artery lymph nodes are negative for tumor (0/8).  C.  LYMPH NODES, SPLENIC ARTERY, EXCISION:      Multiple (2) splenic artery lymph nodes are negative for    12/07/2017 Imaging   CT CAP W Constrast 12/07/17 [facility] IMPRESSION: 1. Status post gastrectomy, without residual or recurrent disease. 2. Left periaortic adenopathy, felt to be similar to the prior exam. Not FDG avid on interval PE T. Favored to be reactive. Recommend attention on follow-up. 3.  Aortic Atherosclerosis (ICD10-I70.0).  This is age advanced. 4. Tiny bilateral pulmonary nodules are unchanged, favoring a benign etiology.   12/10/2017 Mammogram   Diagnostic Mammogram 12/10/17 [facility] FINDINGS:  Mammographically, there is a group of punctate calcifications in the retroareolar right breast anterior third. Oval mass with circumscribed  margins in the outer central aspect of the left breast middle third. On physical examination, there is no palpable abnormality in the left breast at the area of mammographic concern. On targeted ultrasound, there is no sonographic correlate for the  mammographic finding. Incidental intramammary lymph node measuring 8 x 6 x 2 mm at the 3:00 position of the left breast 4 cm from the nipple.   02/26/2018 Imaging   CT AP W contrast 02/26/18 [facility] IMPRESSION: 1. Interval improvement or near complete resolution of the previously seen colitis involving the transverse colon. 2. Mildly thickened segment of small bowel in the left hemipelvis concerning for enteritis. Clinical correlation is recommended. No bowel obstruction. Normal appendix. 3. Large amount of stool throughout the colon. 4. A 3.5 cm left ovarian complex/hemorrhagic cyst. Ultrasound may provide better evaluation of the pelvic structures. Probable right ovarian corpus luteum.    03/02/2018 Procedure   On 03/02/18 EGD performed for dysphagia, esophageal discomfort and abdominal pain and showed mild stricture at EG junction, dilation was performed. Otherwise it was a normal EGD.  FINAL PATHOLOGIC DIAGNOSIS ESOPHAGUS, ENDOSCOPIC BIOPSY:      MILD CHRONIC INFLAMMATION.      NO EVIDENCE OF EOSINOPHILIC ESOPHAGITIS.   03/03/2018 Imaging   MRI AP 03/03/18  [facility]  IMPRESSION: Previous gastrectomy. No evidence of recurrent or metastatic disease within the abdomen or pelvis.  Interval resolution of left ovarian hemorrhagic cyst.   06/07/2018 Imaging   06/07/2018 CT Chest Abdomen and Pelvis [facility] IMPRESSION: 1. No evidence of local tumor recurrence status post distal gastrectomy. 2. No findings suspicious for metastatic disease in the chest, abdomen or pelvis. 3. Previously questioned left para-aortic adenopathy represents a benign venous variant in this patient with a circumaortic left renal vein. 4. Previously described tiny left pulmonary nodules are stable since 07/28/2017 CT and are probably benign. 5. Aortic Atherosclerosis (ICD10-I70.0).   06/11/2018 Mammogram   Diagnostic Mammogram 06/11/18  FINDINGS:  Unchanged group of punctate calcifications in the retroareolar right  breast, anterior third. Unchanged oval equal density mass with circumscribed margins in the outer central aspect of the left breast, middle third. No new suspicious mass, distortion or calcifications in either breast.   10/04/2018 Cancer Staging   Staging form: Stomach, AJCC 8th Edition - Pathologic stage from 10/04/2018: Stage IA (pT1a, pN0, cM0) - Signed by Truitt Merle, MD on 10/04/2018      HISTORY OF PRESENTING ILLNESS:  Brooke Austin 48 y.o. female is a here because of Gastric Cancer. The patient was previously under the care of Dr Ronalee Red at [facility] and since changing insurance she transferred her care to me. The patient presents to the clinic today alone.   In the past 3-4 years she was taking care of her then significant other who was sick with cancer. Afterward to took care of her mother who was sick at 49yo with Gastric cancer and underwent extensive chemo treatment. She was treated in [place], [place]. After her mother passed she married a doctor who notes she was having GI issues. She went for endoscopy and biopsy which showed early stage Gastric cancer which was treated with total gastrectomy. Her maternal grandmother had gastric cancer in her 59s. Her maternal aunt had breast cancer in her 50s. Her mother's cousin passed of gastric cancer. Her genetic testing was negative.  She notes she had to change Medical Oncologist due to change in insurance when her husband moved to [facility] as a hospitalist.   Today the patient notes she has been  suffering from constipation. She often suffers from colitis s/p surgery. She also had abdominal cramps and bowel urgency with little to no output. She was 145lb and now 106lbs since surgery. She does not eat much given her constipation. She eats small meals and low fiber diet. She does take protein shakes. Given weight loss she is a risk for falling. She also notes N&V every time she eats. She wakes up choking in the morning 2 months ago and this continues to  happen. This does effects her sleep. She takes Miralax TID and dulcolax suppository. She notes 3 weeks ago she had coloscopy and endoscopy, but does not know when her information is about this. She notes a pre-cancer polyp was removed. She will need to repeat in 3 years.  She notes her period is coming every 15 days.   Socially she is married, no children, not employed She smokes 2-3 cigarettes daily for 20 years. She did quit for 1 year before. She does not drink alcohol or do recreation drugs.  They have a PMHx of She has sciatica with lower back pain. Which has been chronic since she was young ad tried to control it with yoga and exercise. In the past few month pain has wormseed to 7/10. She notes she fell when standing. She notes she often gets bruised. She notes she has been anxious and depressed. Her anxiety has progressed lately and feels she has trouble staying still. She denies having dark thought or self harm. She had breast benign breast mass removed before. Since her mother passed that was her last connection to her family.    REVIEW OF SYSTEMS:    Constitutional: Denies fevers, chills (+) 40 pounds weight loss (+) trouble sleeping  Eyes: Denies blurriness of vision, double vision or watery eyes Ears, nose, mouth, throat, and face: Denies mucositis or sore throat Respiratory: Denies dyspnea or wheezes  Cardiovascular: Denies palpitation, chest discomfort or lower extremity swelling Gastrointestinal:  (+) Acid reflux symptoms with coughing (+) N&V intermittently (+) constipation  Skin: Denies abnormal skin rashes MSK: (+) Sciatic back pain 7/10 Lymphatics: Denies new lymphadenopathy or easy bruising Neurological:Denies numbness, tingling or new weaknesses Behavioral/Psych: Mood is stable, no new changes (+) anxiety/depression All other systems were reviewed with the patient and are negative.   MEDICAL HISTORY:  Past Medical History:  Diagnosis Date  . Cancer ([place]) 2019   Gastric  Stage 1A  . Colitis     SURGICAL HISTORY: Past Surgical History:  Procedure Laterality Date  . breast surgery    . GASTRECTOMY  2019    SOCIAL HISTORY: Social History   Socioeconomic History  . Marital status: Married    Spouse name: Not on file  . Number of children: 0  . Years of education: Not on file  . Highest education level: Not on file  Occupational History  . Not on file  Social Needs  . Financial resource strain: Not on file  . Food insecurity    Worry: Not on file    Inability: Not on file  . Transportation needs    Medical: Not on file    Non-medical: Not on file  Tobacco Use  . Smoking status: Current Every Day Smoker    Packs/day: 0.25    Years: 20.00    Pack years: 5.00  . Smokeless tobacco: Never Used  Substance and Sexual Activity  . Alcohol use: Not Currently  . Drug use: Not Currently  . Sexual activity: Yes  Lifestyle  .  Physical activity    Days per week: Not on file    Minutes per session: Not on file  . Stress: Not on file  Relationships  . Social Herbalist on phone: Not on file    Gets together: Not on file    Attends religious service: Not on file    Active member of club or organization: Not on file    Attends meetings of clubs or organizations: Not on file    Relationship status: Not on file  . Intimate partner violence    Fear of current or ex partner: Not on file    Emotionally abused: Not on file    Physically abused: Not on file    Forced sexual activity: Not on file  Other Topics Concern  . Not on file  Social History Narrative  . Not on file    FAMILY HISTORY: Family History  Problem Relation Age of Onset  . Stomach cancer Mother 26  . Cancer Maternal Aunt 40       breast cancer   . Stomach cancer Maternal Grandmother 7  . Stomach cancer Other     ALLERGIES:  has No Known Allergies.  MEDICATIONS:  Current Outpatient Medications  Medication Sig Dispense Refill  . baclofen (LIORESAL) 10 MG tablet  Take 0.5-1 tablets (5-10 mg total) by mouth 3 (three) times daily as needed for muscle spasms. 30 each 1  . bisacodyl (DULCOLAX) 10 MG suppository Place 10 mg rectally daily as needed for mild constipation or moderate constipation.     . cyanocobalamin (,VITAMIN B-12,) 1000 MCG/ML injection Inject 1 mL (1,000 mcg total) into the muscle every 30 (thirty) days. 1 mL 5  . hydrocortisone 2.5 % cream     . ketoconazole (NIZORAL) 2 % shampoo     . methylPREDNISolone (MEDROL DOSEPAK) 4 MG TBPK tablet As directed for 6 days. 21 tablet 0  . PARoxetine (PAXIL) 40 MG tablet Take 40 mg by mouth daily.    . polyethylene glycol (MIRALAX / GLYCOLAX) 17 g packet Take 17 g by mouth 2 (two) times a day.    . temazepam (RESTORIL) 15 MG capsule Take 1 capsule (15 mg total) by mouth at bedtime. 30 capsule 0  . terbinafine (LAMISIL) 250 MG tablet     . traMADol (ULTRAM) 50 MG tablet Take 1 tablet (50 mg total) by mouth every 6 (six) hours as needed. 30 tablet 0  . mirtazapine (REMERON) 7.5 MG tablet Take 1 tablet (7.5 mg total) by mouth at bedtime. 30 tablet 1  . Syringe/Needle, Disp, (SYRINGE 3CC/23GX1") 23G X 1" 3 ML MISC 1 Syringe by Does not apply route every 30 (thirty) days. 1 each 5   No current facility-administered medications for this visit.     PHYSICAL EXAMINATION: ECOG PERFORMANCE STATUS: 3 - Symptomatic, >50% confined to bed  Vitals:   10/04/18 1511  BP: 124/86  Pulse: 68  Resp: 16  Temp: 98 F (36.7 C)  SpO2: 100%   Filed Weights   10/04/18 1511  Weight: 106 lb 6.4 oz (48.3 kg)    GENERAL:alert, no distress and comfortable, very thin female  SKIN: skin color, texture, turgor are normal (+) tiny superficial skin nodule of right lateral scalp, non-tender EYES: normal, Conjunctiva are pink and non-injected, sclera clear  NECK: supple, thyroid normal size, non-tender, without nodularity LYMPH:  no palpable lymphadenopathy in the cervical, axillary  LUNGS: clear to auscultation and  percussion with normal breathing effort HEART: regular  rate & rhythm and no murmurs and no lower extremity edema ABDOMEN:abdomen soft, non-tender and normal bowel sounds (+)Midline surgical incision healed well with mild tenderness, no organomegaly  Musculoskeletal:no cyanosis of digits and no clubbing  NEURO: alert & oriented x 3 with fluent speech, no focal motor/sensory deficits  LABORATORY DATA:  I have reviewed the data as listed CBC Latest Ref Rng & Units 10/04/2018 09/21/2018  WBC 4.0 - 10.5 K/uL 10.7(H) 11.0(H)  Hemoglobin 12.0 - 15.0 g/dL 12.7 13.5  Hematocrit 36.0 - 46.0 % 37.9 41.7  Platelets 150 - 400 K/uL 351 352    CMP Latest Ref Rng & Units 10/04/2018 09/21/2018  Glucose 70 - 99 mg/dL 110(H) 91  BUN 6 - 20 mg/dL 18 13  Creatinine 0.44 - 1.00 mg/dL 0.78 0.75  Sodium 135 - 145 mmol/L 138 138  Potassium 3.5 - 5.1 mmol/L 3.7 3.9  Chloride 98 - 111 mmol/L 100 105  CO2 22 - 32 mmol/L 29 25  Calcium 8.9 - 10.3 mg/dL 8.5(L) 9.1  Total Protein 6.5 - 8.1 g/dL 6.6 7.7  Total Bilirubin 0.3 - 1.2 mg/dL 0.3 0.4  Alkaline Phos 38 - 126 U/L 82 89  AST 15 - 41 U/L 35 33  ALT 0 - 44 U/L 35 46(H)     RADIOGRAPHIC STUDIES: I have personally reviewed the radiological images as listed and agreed with the findings in the report. Ct Abdomen Pelvis W Contrast  Result Date: 09/21/2018 CLINICAL DATA:  Abdominal pain EXAM: CT ABDOMEN AND PELVIS WITH CONTRAST TECHNIQUE: Multidetector CT imaging of the abdomen and pelvis was performed using the standard protocol following bolus administration of intravenous contrast. CONTRAST:  80mL OMNIPAQUE IOHEXOL 300 MG/ML  SOLN COMPARISON:  06/07/2018 FINDINGS: Lower chest: Lung bases are clear. No effusions. Heart is normal size. Hepatobiliary: No focal hepatic abnormality. Gallbladder unremarkable. Pancreas: No focal abnormality or ductal dilatation. Spleen: No focal abnormality.  Normal size. Adrenals/Urinary Tract: No adrenal abnormality. No focal renal  abnormality. No stones or hydronephrosis. Urinary bladder is unremarkable. Stomach/Bowel: Stomach, large and small bowel grossly unremarkable. Large stool burden throughout the colon. No evidence of bowel obstruction. Appendix not definitively seen. Vascular/Lymphatic: No evidence of aneurysm or adenopathy. Reproductive: Uterus and adnexa unremarkable.  No mass. Other: No free fluid or free air. Musculoskeletal: No acute bony abnormality. IMPRESSION: Moderate stool burden throughout the colon. No evidence of bowel obstruction. Nonvisualization of the appendix. No pericecal inflammatory process. Electronically Signed   By: Rolm Baptise M.D.   On: 09/21/2018 03:56    ASSESSMENT & PLAN:  Ellyson Rarick is a 48 y.o. [place] female with a history of Underweight, H/o Left Ovarian cyst, left breast mass and right breast calcifications.  1. Gastric Cancer, Stage I, pT1aN0M0 -I have reviewed her outside medical records, and confirmed key findings with pt. Diagnosed in 07/2017. S/p total gastrectomy due to her strong family history of gastric cancer.  -I discussed her cancer was very early stage and, s/p complete resection her risk for recurrence is low.  -I discussed the surveillance plan, which is a physical exam and lab test (including CBC, CMP) every 3-6 months for the first 2 years, then every 6-12 months for year 3-5, and surveillance CT scan only if clinically indicated.  -She notes she had Colonoscopy/endoscopy in 08/2018. She does not have report but notes a colon polyp was removed. She will repeat in 3 years. She has been referred to GI, waiting for appointment  -Overall she has been having a difficult  time in recovering from surgery with significant weight loss, N&V, Constipation and acid reflux. Will work to control her symptoms and improve her quality of life.  -Physical exam today was unremarkable.  -We discussed the nutritional challenge after total gastrostomy and common iron and vitamin  deficiency after surgery. Will obtain baseline labs and vitamin levels. May require Multivitamin infusions. She is agreeable.  -F/u in 4 months, lab frequency will depend on today's lab results.   2. maulnutrion, Nausea, vomiting, Constipation, Acid Reflux -Since gastrectomy her GI symptoms have been greatly effecting her quality of life  -I recommend she try Prilosec OTC  -She will continue Mira lax TID.  -I will call in mirtazapine 7.5mg  to help her appetite and insomnia.  -I will refer her to see our dietician  3. Chronic sciatica low back pain, Joint pain of b/l knees  -Chronic since she was younger and tried to control it with yoga and exercise. In the past few month pain has worsened to constant 7/10 -She sees orthopedic surgeon, Dr. Junius Roads, but is waiting on insurance to get spine MRI.  -She has been treated with steroids and may start epidural injections for pain.  -On Tramadol 50mg  q6hours  -I will check Vitamin D level.   4. Insomnia, Depression/Anxiety -On Paxil -Since surgery her anxiety has worsened given her symptoms.  -She denies suicidal idea  -I recommend she seek counseling. I will refer her to our social worker for counseiling. She can see her PCP to help her manage her depression and medication as well.  -I will start her on Mirtazapine. I instructed her to wean off Paxil once she starts.   5. B12 deficiency -Secondary to poor absorption from Gastrectomy  -She has tingling of finger, toes and ears. She has been prone to falling when standing.  -She has been receiving B12 injections, last 1 month ago. -I will call in B12 injections for home administration. Continue monthly    6. Right breast calcifications and left breast mass -as seen on her 12/10/17 Mammogram and stable on 06/11/18 mammogram -will repeat in Sep.   7. Genetics -Patient has personal and family hx of gastric cancer (mother, MGM, [place]) -Genetic testing from [facility] was negative  8. Smoking Cessation   -She has been smoking about 3 cigarettes daily for 20 years.  - I discussed complete smoking cessation as this can cause her further heath complications. She understands.    PLAN:  -Lab today  -dietician and SW referral  -I called in low dose mirtazapine 7.5, to start so she can ween off Paxil -I called in monthly B12 self injections to [place] -I refilled Restoril today  -Lab and f/u in 4 months -mammogram at [facility] in 2 months    Orders Placed This Encounter  Procedures  . MM DIAG BREAST TOMO BILATERAL    Last mammogram at [facility] in 05/2018    Standing Status:   Future    Standing Expiration Date:   10/04/2019    Order Specific Question:   Reason for Exam (SYMPTOM  OR DIAGNOSIS REQUIRED)    Answer:   f/u calcification    Order Specific Question:   Is the patient pregnant?    Answer:   No    Order Specific Question:   Preferred imaging location?    Answer:   [facility]  . CBC with Differential (Cancer Center Only)    Standing Status:   Future    Number of Occurrences:   1  Standing Expiration Date:   10/04/2019  . Retic Panel    Standing Status:   Future    Number of Occurrences:   1    Standing Expiration Date:   10/04/2019  . CMP ([place] only)    Standing Status:   Standing    Number of Occurrences:   30    Standing Expiration Date:   10/04/2023  . Ferritin    Standing Status:   Future    Number of Occurrences:   1    Standing Expiration Date:   10/04/2019  . Iron and TIBC    Standing Status:   Future    Number of Occurrences:   1    Standing Expiration Date:   10/04/2019  . Vitamin B12    Standing Status:   Future    Number of Occurrences:   1    Standing Expiration Date:   10/04/2019  . CEA (IN HOUSE-[facility])    Standing Status:   Standing    Number of Occurrences:   230    Standing Expiration Date:   10/04/2023  . Copper, serum    Standing Status:   Future    Number of Occurrences:   1    Standing Expiration Date:   10/04/2019  . Vitamin A    Standing  Status:   Future    Number of Occurrences:   1    Standing Expiration Date:   10/04/2019  . Vitamin C    Standing Status:   Future    Number of Occurrences:   1    Standing Expiration Date:   10/04/2019  . Zinc    Standing Status:   Future    Number of Occurrences:   1    Standing Expiration Date:   10/04/2019  . Vitamin D 25 hydroxy    Standing Status:   Standing    Number of Occurrences:   50    Standing Expiration Date:   10/04/2023  . Methylmalonic acid, serum    Standing Status:   Standing    Number of Occurrences:   20    Standing Expiration Date:   10/04/2023    All questions were answered. The patient knows to call the clinic with any problems, questions or concerns. I spent 40 minutes counseling the patient face to face. The total time spent in the appointment was 50 minutes and more than 50% was on counseling.     Truitt Merle, MD 10/04/2018 8:29 PM  I, Joslyn Devon, am acting as scribe for Truitt Merle, MD.   I have reviewed the above documentation for accuracy and completeness, and I agree with the above.

## 2018-10-01 ENCOUNTER — Ambulatory Visit (INDEPENDENT_AMBULATORY_CARE_PROVIDER_SITE_OTHER): Payer: Commercial Managed Care - PPO | Admitting: Family Medicine

## 2018-10-01 ENCOUNTER — Encounter: Payer: Self-pay | Admitting: Family Medicine

## 2018-10-01 ENCOUNTER — Other Ambulatory Visit: Payer: Self-pay

## 2018-10-01 DIAGNOSIS — M545 Low back pain: Secondary | ICD-10-CM | POA: Diagnosis not present

## 2018-10-01 DIAGNOSIS — G8929 Other chronic pain: Secondary | ICD-10-CM

## 2018-10-01 MED ORDER — METHYLPREDNISOLONE 4 MG PO TBPK: ORAL_TABLET | ORAL | 0 refills | Status: DC

## 2018-10-01 MED ORDER — BACLOFEN 10 MG PO TABS: 5.0000 mg | ORAL_TABLET | Freq: Three times a day (TID) | ORAL | 1 refills | Status: DC | PRN

## 2018-10-01 MED ORDER — TRAMADOL HCL 50 MG PO TABS: 50.0000 mg | ORAL_TABLET | Freq: Four times a day (QID) | ORAL | 0 refills | Status: DC | PRN

## 2018-10-01 MED FILL — traMADol HCL 50 MG TABS: 50 | 7 days supply | Qty: 30 | Fill #0

## 2018-10-01 MED FILL — METHYLPREDNISOLONE 4 MG TBP: 4 | 6 days supply | Qty: 21 | Fill #0

## 2018-10-01 MED FILL — BACLOFEN 10 MG TABS: 10 | 10 days supply | Qty: 30 | Fill #0

## 2018-10-01 NOTE — Progress Notes (Signed)
   Office Visit Note   Patient: Brooke Austin           Date of Birth: [birth year]           MRN: [id number] Visit Date: 10/01/2018 Requested by: Girtha Rm, NP-C [place],  [place] [address] PCP: Girtha Rm, NP-C  Subjective: Chief Complaint  Patient presents with  . Lower Back - Pain, Follow-up    HPI: She is here with persistent low back pain.  She fell a couple times since last visit.  Still has not been called for MRI appointment.  Zanaflex helps but only a little bit.  Pain from the posterior right hip down the leg with tingling.              ROS: No fevers or chills.  All other systems were reviewed and are negative.  Objective: Vital Signs: LMP 09/08/2018   Physical Exam:  General:  Alert and oriented, in no acute distress. Pulm:  Breathing unlabored. Psy:  Normal mood, congruent affect. Skin: Some bruising on the lateral right hip, no rash. Low back: No significant bony tenderness to suggest fracture.  Tender in the right sciatic notch.  Straight leg raise positive, lower extremity strength and reflexes still normal.  Imaging: None today.  Assessment & Plan: 1.  Persistent low back pain with bilateral sciatica symptoms -Proceed with MRI scan.  Trial of baclofen, tramadol as needed and Medrol Dosepak.  Depending on MRI results, could contemplate epidural injection.     Procedures: No procedures performed  No notes on file     PMFS History: Patient Active Problem List   Diagnosis Date Noted  . History of gastric cancer 09/22/2018  . Patient had 2 or more falls in past year 09/22/2018  . Acute midline low back pain without sciatica 09/22/2018  . Anxiety and depression 09/22/2018  . Anxiety 08/04/2018  . Insomnia 08/04/2018  . Ovarian mass, left 03/02/2018  . Abdominal pain 02/26/2018  . Constipation 02/26/2018  . Underweight 01/01/2018  . Iron deficiency anemia 09/08/2017  . 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  Barrett's esophagus without dysplasia 07/27/2017  . Family history of gastric cancer 07/27/2017  . Malignant neoplasm of stomach ([facility]) 07/27/2017  . Helicobacter pylori gastritis 07/27/2017   Past Medical History:  Diagnosis Date  . Cancer ([facility]) 2019   Gastric Stage 1A  . Colitis     Family History  Problem Relation Age of Onset  . Stomach cancer Mother        died at 39    Past Surgical History:  Procedure Laterality Date  . breast surgery    . GASTRECTOMY  2019   Social History   Occupational History  . Not on file  Tobacco Use  . Smoking status: Current Every Day Smoker    Packs/day: 1.00  . Smokeless tobacco: Never Used  Substance and Sexual Activity  . Alcohol use: Not Currently  . Drug use: Not Currently  . Sexual activity: Yes

## 2018-10-04 ENCOUNTER — Encounter: Payer: Self-pay | Admitting: Hematology

## 2018-10-04 ENCOUNTER — Inpatient Hospital Stay: Payer: 59

## 2018-10-04 ENCOUNTER — Inpatient Hospital Stay: Payer: 59 | Attending: Hematology | Admitting: Hematology

## 2018-10-04 ENCOUNTER — Other Ambulatory Visit: Payer: Self-pay

## 2018-10-04 VITALS — BP 124/86 | HR 68 | Temp 98.0°F | Resp 16 | Wt 106.4 lb

## 2018-10-04 DIAGNOSIS — K219 Gastro-esophageal reflux disease without esophagitis: Secondary | ICD-10-CM | POA: Diagnosis not present

## 2018-10-04 DIAGNOSIS — G4700 Insomnia, unspecified: Secondary | ICD-10-CM | POA: Insufficient documentation

## 2018-10-04 DIAGNOSIS — R112 Nausea with vomiting, unspecified: Secondary | ICD-10-CM | POA: Diagnosis not present

## 2018-10-04 DIAGNOSIS — F418 Other specified anxiety disorders: Secondary | ICD-10-CM | POA: Diagnosis not present

## 2018-10-04 DIAGNOSIS — M25561 Pain in right knee: Secondary | ICD-10-CM

## 2018-10-04 DIAGNOSIS — Z903 Acquired absence of stomach [part of]: Secondary | ICD-10-CM | POA: Insufficient documentation

## 2018-10-04 DIAGNOSIS — Z800 Family history of malignant neoplasm of digestive organs: Secondary | ICD-10-CM | POA: Diagnosis not present

## 2018-10-04 DIAGNOSIS — K5900 Constipation, unspecified: Secondary | ICD-10-CM

## 2018-10-04 DIAGNOSIS — Z720 Tobacco use: Secondary | ICD-10-CM | POA: Diagnosis not present

## 2018-10-04 DIAGNOSIS — E538 Deficiency of other specified B group vitamins: Secondary | ICD-10-CM | POA: Diagnosis not present

## 2018-10-04 DIAGNOSIS — R921 Mammographic calcification found on diagnostic imaging of breast: Secondary | ICD-10-CM | POA: Diagnosis not present

## 2018-10-04 DIAGNOSIS — N6320 Unspecified lump in the left breast, unspecified quadrant: Secondary | ICD-10-CM | POA: Insufficient documentation

## 2018-10-04 DIAGNOSIS — R634 Abnormal weight loss: Secondary | ICD-10-CM

## 2018-10-04 DIAGNOSIS — M5430 Sciatica, unspecified side: Secondary | ICD-10-CM | POA: Diagnosis not present

## 2018-10-04 DIAGNOSIS — C161 Malignant neoplasm of fundus of stomach: Secondary | ICD-10-CM

## 2018-10-04 DIAGNOSIS — Z85028 Personal history of other malignant neoplasm of stomach: Secondary | ICD-10-CM

## 2018-10-04 DIAGNOSIS — C169 Malignant neoplasm of stomach, unspecified: Secondary | ICD-10-CM | POA: Insufficient documentation

## 2018-10-04 DIAGNOSIS — Z803 Family history of malignant neoplasm of breast: Secondary | ICD-10-CM | POA: Insufficient documentation

## 2018-10-04 DIAGNOSIS — M25562 Pain in left knee: Secondary | ICD-10-CM

## 2018-10-04 LAB — CBC WITH DIFFERENTIAL (CANCER CENTER ONLY)
Abs Immature Granulocytes: 0.03 10*3/uL (ref 0.00–0.07)
Basophils Absolute: 0 10*3/uL (ref 0.0–0.1)
Basophils Relative: 0 %
Eosinophils Absolute: 0.1 10*3/uL (ref 0.0–0.5)
Eosinophils Relative: 1 %
HCT: 37.9 % (ref 36.0–46.0)
Hemoglobin: 12.7 g/dL (ref 12.0–15.0)
Immature Granulocytes: 0 %
Lymphocytes Relative: 18 %
Lymphs Abs: 1.9 10*3/uL (ref 0.7–4.0)
MCH: 32 pg (ref 26.0–34.0)
MCHC: 33.5 g/dL (ref 30.0–36.0)
MCV: 95.5 fL (ref 80.0–100.0)
Monocytes Absolute: 0.6 10*3/uL (ref 0.1–1.0)
Monocytes Relative: 6 %
Neutro Abs: 8 10*3/uL — ABNORMAL HIGH (ref 1.7–7.7)
Neutrophils Relative %: 75 %
Platelet Count: 351 10*3/uL (ref 150–400)
RBC: 3.97 MIL/uL (ref 3.87–5.11)
RDW: 13.3 % (ref 11.5–15.5)
WBC Count: 10.7 10*3/uL — ABNORMAL HIGH (ref 4.0–10.5)
nRBC: 0 % (ref 0.0–0.2)

## 2018-10-04 LAB — CMP (CANCER CENTER ONLY)
ALT: 35 U/L (ref 0–44)
AST: 35 U/L (ref 15–41)
Albumin: 3.9 g/dL (ref 3.5–5.0)
Alkaline Phosphatase: 82 U/L (ref 38–126)
Anion gap: 9 (ref 5–15)
BUN: 18 mg/dL (ref 6–20)
CO2: 29 mmol/L (ref 22–32)
Calcium: 8.5 mg/dL — ABNORMAL LOW (ref 8.9–10.3)
Chloride: 100 mmol/L (ref 98–111)
Creatinine: 0.78 mg/dL (ref 0.44–1.00)
GFR, Est AFR Am: >60 mL/min (ref >60)
GFR, Estimated: >60 mL/min (ref >60)
Glucose, Bld: 110 mg/dL — ABNORMAL HIGH (ref 70–99)
Potassium: 3.7 mmol/L (ref 3.5–5.1)
Sodium: 138 mmol/L (ref 135–145)
Total Bilirubin: 0.3 mg/dL (ref 0.3–1.2)
Total Protein: 6.6 g/dL (ref 6.5–8.1)

## 2018-10-04 LAB — RETIC PANEL
Immature Retic Fract: 8.3 % (ref 2.3–15.9)
RBC.: 3.96 MIL/uL (ref 3.87–5.11)
Retic Count, Absolute: 45.5 10*3/uL (ref 19.0–186.0)
Retic Ct Pct: 1.2 % (ref 0.4–3.1)
Reticulocyte Hemoglobin: 35.7 pg (ref >27.9)

## 2018-10-04 LAB — VITAMIN B12: Vitamin B-12: 206 pg/mL (ref 180–914)

## 2018-10-04 MED ORDER — MIRTAZAPINE 7.5 MG PO TABS: 7.5000 mg | ORAL_TABLET | Freq: Every day | ORAL | 1 refills | Status: DC

## 2018-10-04 MED ORDER — "SYRINGE 23G X 1"" 3 ML MISC": 1.0000 | 5 refills | Status: AC

## 2018-10-04 MED ORDER — TEMAZEPAM 15 MG PO CAPS: 15.0000 mg | ORAL_CAPSULE | Freq: Every day | ORAL | 0 refills | Status: DC

## 2018-10-04 MED ORDER — CYANOCOBALAMIN 1000 MCG/ML IJ SOLN: 1000.0000 ug | INTRAMUSCULAR | 5 refills | Status: DC

## 2018-10-04 MED FILL — TEMAZEPAM 15 MG CAPSULE: 15 | 30 days supply | Qty: 30 | Fill #0

## 2018-10-04 MED FILL — MIRTAZAPINE 7.5 MG TABLET: 7.5 | 30 days supply | Qty: 30 | Fill #0

## 2018-10-04 MED FILL — CYANOCOBALAMIN 1,000 MCG/ML: 1000 | 30 days supply | Qty: 1 | Fill #0

## 2018-10-05 ENCOUNTER — Telehealth: Payer: Self-pay | Admitting: Hematology

## 2018-10-05 ENCOUNTER — Telehealth: Payer: Self-pay | Admitting: *Deleted

## 2018-10-05 LAB — IRON AND TIBC
Iron: 85 ug/dL (ref 41–142)
Saturation Ratios: 31 % (ref 21–57)
TIBC: 278 ug/dL (ref 236–444)
UIBC: 192 ug/dL (ref 120–384)

## 2018-10-05 LAB — CEA (IN HOUSE-CHCC): CEA (CHCC-In House): 6.47 ng/mL — ABNORMAL HIGH (ref 0.00–5.00)

## 2018-10-05 LAB — VITAMIN D 25 HYDROXY (VIT D DEFICIENCY, FRACTURES): Vit D, 25-Hydroxy: 46.1 ng/mL (ref 30.0–100.0)

## 2018-10-05 LAB — FERRITIN: Ferritin: 79 ng/mL (ref 11–307)

## 2018-10-05 NOTE — Telephone Encounter (Signed)
Notified of message below. Verbalized understanding 

## 2018-10-05 NOTE — Telephone Encounter (Signed)
Scheduled appt per 7/13 los. °Printed and mailed appt calendar. °

## 2018-10-05 NOTE — Telephone Encounter (Signed)
Please let her know that I just refilled her Temazepam yesterday, which she can use as needed for anxiety also. Xanax is in the same category drug, I would not recommend to use both at same time. Please encourage her to anxiety issues with our SW (I referred her yesterday) and her PCP. Thanks   Truitt Merle MD

## 2018-10-05 NOTE — Telephone Encounter (Signed)
Brooke Austin left a message stating Dr Burr Medico discussed starting her on Xanax because she is "so anxious", but she has not heard from the pharmacy regarding prescription.

## 2018-10-06 ENCOUNTER — Encounter: Payer: Self-pay | Admitting: General Practice

## 2018-10-06 MED FILL — PARoxetine HCL 40 MG TABS: 40 | 30 days supply | Qty: 30 | Fill #2

## 2018-10-06 NOTE — Progress Notes (Signed)
[facility] Comprehensive Psychosocial Assessment Clinical Social Work  Clinical Social Work was referred by Futures trader for assessment of needs for support/resources.  Clinical Social Worker Edwyna Shell spoke w patient by phone to assess psychosocial, emotional, mental health, and spiritual needs of the patient.  Patient's knowledge about cancer and its treatment including level of understanding, reactions, goals for care, and expectations:  Is s/p gastrectomy after dx of gastric cancer.  Adjusting to "new normal" of ways of eating, physical activity.  Prior to cancer dx (and for most of her life) pt was vegetarian and practiced yoga.  Both of these are now quite difficult.  Needs to avoid drinking large amounts and must monitor certain foods that do not agree w her.  Now eats meat, which is completely new to her.  Recent transfer of care to [facility] due to insurance requirements.  Multiple experiences w cancer including mother (who died in June 20, 2017 from similar cancer) and fiance (diagnosed w glioblastoma).  Was mother's caregiver throughout her illness.    Characteristics of the patient's support system:  Moved to [facility] 04/[place], recently married.  Husband has stressful job.  Pt cannot go out much due to recovery from surgery, cancer treatment and current COVID precautions.  Father and two brothers now live in [place].  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  Immediately prior to dx, older brother became his guardian and moved him back to [place].  Pt has limited contact w father and brother, calls younger brother often but has not seen him.  Mother in law has multiple health issues/crises - recently moved to assisted living facility near son/patient's home.  Pt has frequent contact w mother in law.    Patient and family psychosocial functioning including strengths, limitations, and coping skills:  Reports significant anxiety which has impacted  her sleep.  Eating has been disordered post gastrectomy which is understandable.  Worries about her health and weight loss "I dont know how to gain weight."  Reports significant recent weight loss which is distressing.  Also reports near constant anxiety "I cant calm down."  Again, this is understandable given multiple stressors prior to cancer diagnosis, challenges of cancer, COVID 19 pandemic, social isolation.  Used to enjoy activities like yoga and belly dancing - "I had a lot of joy" - now these activities are severely limited due to her physical condition.  Reminded patient of yoga principles of breath control and mindfulness/staying in the present moment.  Pt aware of these strategies from lifelong yoga practice "I had forgotten about that - I've been so anxious."  Husband is supportive but has stress from job and demands of caring for his mother as well as wife.   Identifications of barriers to care:  COVID restrictions limit in person meetings and travel.  Can be sent information on multiple virtual sources of support.  Pt has smart phone, will need to learn to use Zoom platform in order to participate in these meetings/offerings.    Availability of community resources:  [place], [place], [place] - all have virtual options available.  CSW will email list of resources. Encouraged pt to participate in Conversations w a Purpose tomorrow in order to begin to connect w other cancer survivors.    Clinical Social Worker follow up needed: Yes.  Scheduled call form Thurs 7/23 at 10 AM and [employer].    If yes, follow up plan:  See above  Edwyna Shell, [place] Social Worker Phone:  [phone]

## 2018-10-07 LAB — METHYLMALONIC ACID, SERUM: Methylmalonic Acid, Quantitative: 192 nmol/L (ref 0–378)

## 2018-10-07 LAB — COPPER, SERUM: Copper: 79 ug/dL (ref 72–166)

## 2018-10-07 LAB — ZINC: Zinc: 61 ug/dL (ref 56–134)

## 2018-10-07 LAB — VITAMIN C: Vitamin C: 2.1 mg/dL — ABNORMAL HIGH (ref 0.4–2.0)

## 2018-10-07 LAB — VITAMIN A: Vitamin A (Retinoic Acid): 44.6 ug/dL (ref 20.1–62.0)

## 2018-10-08 ENCOUNTER — Telehealth: Payer: Self-pay | Admitting: Hematology

## 2018-10-08 NOTE — Telephone Encounter (Signed)
Confirmed appt for 7/20 and 7/23/verified info

## 2018-10-11 ENCOUNTER — Telehealth: Payer: Self-pay

## 2018-10-11 ENCOUNTER — Inpatient Hospital Stay: Payer: 59 | Admitting: Nutrition

## 2018-10-11 ENCOUNTER — Encounter: Payer: Self-pay | Admitting: Nutrition

## 2018-10-11 DIAGNOSIS — H52221 Regular astigmatism, right eye: Secondary | ICD-10-CM | POA: Diagnosis not present

## 2018-10-11 NOTE — Telephone Encounter (Signed)
-----   Message from Truitt Merle, MD sent at 10/10/2018  6:42 PM EDT ----- Please let pt know all her lab results, CBC, CMP, iron study, B12, VitA, C, D, copper, zinc, are all WNL,except mildly low calcium, I suggest her to take OTC calcium and VitD if she can tolerate, or calcium rich food. Tumor marker CEA slightly elevated, which is likely non-specific, and will check again on next visit, thanks   Truitt Merle  10/10/2018

## 2018-10-11 NOTE — Telephone Encounter (Signed)
Spoke with patient regarding her lab results.  Per Dr. Burr Medico (see her note).  Patient verbalized an understanding.  She stated she cannot take oral calcium and is Lactose intolerant.  Informed her we will continue to monitor.

## 2018-10-11 NOTE — Progress Notes (Signed)
Patient cancelled nutrition appointment for July 20. She did not reschedule.

## 2018-10-12 ENCOUNTER — Other Ambulatory Visit: Payer: Self-pay | Admitting: Family Medicine

## 2018-10-12 MED FILL — BACLOFEN 10 MG TABS: 10 | 10 days supply | Qty: 30 | Fill #1

## 2018-10-12 MED FILL — traMADol HCL 50 MG TABS: 50 | 7 days supply | Qty: 30 | Fill #0

## 2018-10-14 ENCOUNTER — Encounter: Payer: Self-pay | Admitting: General Practice

## 2018-10-14 ENCOUNTER — Other Ambulatory Visit: Payer: Self-pay

## 2018-10-14 ENCOUNTER — Inpatient Hospital Stay: Payer: 59 | Admitting: General Practice

## 2018-10-14 NOTE — Progress Notes (Signed)
[place] CSW Progress Notes  Met w patient in [facility] conference room to continue to assess and provide support for cancer related anxiety concerns.  Patient tearful, overwhelmed by walking through lobby and viewing patients in midst of treatment.  Flooded w memories of multiple loved ones impacted by cancer - mother, first husband and fiance all diagnosed w cancer.  Mother and husband now deceased.  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  Since surgery has been reluctant to reengage in yoga for fear of further injury after gastrectomy.  Has been told by surgeon to exercise but pay attention to her body limits.  Explored options available to her to reengage w yoga in some form.  Also discussed increasing her social support by connecting with others who are cancer survivors.  Has resources available to her via [employer], has this information.  Will continue to work on necessary restructuring of identity and purpose post cancer diagnosis and treatment.  Will meet by phone in two weeks 8/5 at [place], [place] Worker Phone:  [phone]

## 2018-10-18 ENCOUNTER — Telehealth: Payer: Self-pay | Admitting: Family Medicine

## 2018-10-18 ENCOUNTER — Ambulatory Visit
Admission: RE | Admit: 2018-10-18 | Discharge: 2018-10-18 | Disposition: A | Discharge status: Home / Self Care | Payer: Commercial Managed Care - PPO | Source: Ambulatory Visit | Attending: Family Medicine | Admitting: Family Medicine

## 2018-10-18 ENCOUNTER — Other Ambulatory Visit: Payer: Self-pay

## 2018-10-18 ENCOUNTER — Ambulatory Visit
Admission: RE | Admit: 2018-10-18 | Discharge: 2018-10-18 | Disposition: A | Discharge status: Home / Self Care | Payer: 59 | Source: Ambulatory Visit | Attending: Family Medicine | Admitting: Family Medicine

## 2018-10-18 DIAGNOSIS — Z85028 Personal history of other malignant neoplasm of stomach: Secondary | ICD-10-CM

## 2018-10-18 DIAGNOSIS — G894 Chronic pain syndrome: Secondary | ICD-10-CM

## 2018-10-18 DIAGNOSIS — M5126 Other intervertebral disc displacement, lumbar region: Secondary | ICD-10-CM | POA: Diagnosis not present

## 2018-10-18 DIAGNOSIS — M47816 Spondylosis without myelopathy or radiculopathy, lumbar region: Secondary | ICD-10-CM | POA: Diagnosis not present

## 2018-10-18 DIAGNOSIS — G8929 Other chronic pain: Secondary | ICD-10-CM

## 2018-10-18 DIAGNOSIS — M5442 Lumbago with sciatica, left side: Secondary | ICD-10-CM

## 2018-10-18 DIAGNOSIS — M48061 Spinal stenosis, lumbar region without neurogenic claudication: Secondary | ICD-10-CM | POA: Diagnosis not present

## 2018-10-18 DIAGNOSIS — M5441 Lumbago with sciatica, right side: Secondary | ICD-10-CM

## 2018-10-18 MED ORDER — GADOBENATE DIMEGLUMINE 529 MG/ML IV SOLN
10.0000 mL | Freq: Once | INTRAVENOUS | Status: AC | PRN
  Administered 2018-10-18: 12:00:00 10 mL via INTRAVENOUS

## 2018-10-18 NOTE — Telephone Encounter (Signed)
Lumbar MRI scan shows moderate to severe spinal stenosis at the L4-5 level with significant narrowing of the nerve openings.  This could certainly be the source of pain.  Epidural steroid injection would be a consideration.  Physical therapy is another treatment option.  Surgery would be the last resort option.

## 2018-10-19 NOTE — Addendum Note (Signed)
Addended by: Hortencia Pilar on: 10/19/2018 11:51 AM   Modules accepted: Orders

## 2018-10-19 NOTE — Addendum Note (Signed)
Addended by: Hortencia Pilar on: 10/19/2018 01:05 PM   Modules accepted: Orders

## 2018-10-20 ENCOUNTER — Other Ambulatory Visit: Payer: Self-pay

## 2018-10-20 NOTE — Addendum Note (Signed)
Addended by: Hortencia Pilar on: 10/20/2018 02:25 PM   Modules accepted: Orders

## 2018-10-21 ENCOUNTER — Other Ambulatory Visit: Payer: Self-pay | Admitting: Family Medicine

## 2018-10-21 ENCOUNTER — Ambulatory Visit: Payer: 59 | Admitting: Obstetrics & Gynecology

## 2018-10-21 MED FILL — KETOCONAZOLE 2% SHAMPOO: 2 | 30 days supply | Qty: 120 | Fill #1

## 2018-10-21 MED FILL — HYDROCORTISONE 2.5% CREAM: 2.5 | 10 days supply | Qty: 30 | Fill #1

## 2018-10-22 ENCOUNTER — Encounter: Payer: Self-pay | Admitting: Obstetrics & Gynecology

## 2018-10-22 ENCOUNTER — Ambulatory Visit (INDEPENDENT_AMBULATORY_CARE_PROVIDER_SITE_OTHER): Payer: 59 | Admitting: Obstetrics & Gynecology

## 2018-10-22 ENCOUNTER — Other Ambulatory Visit: Payer: Self-pay

## 2018-10-22 VITALS — BP 112/70 | Ht 66.5 in | Wt 100.0 lb

## 2018-10-22 DIAGNOSIS — C161 Malignant neoplasm of fundus of stomach: Secondary | ICD-10-CM

## 2018-10-22 DIAGNOSIS — N926 Irregular menstruation, unspecified: Secondary | ICD-10-CM | POA: Diagnosis not present

## 2018-10-22 DIAGNOSIS — R636 Underweight: Secondary | ICD-10-CM | POA: Diagnosis not present

## 2018-10-22 DIAGNOSIS — F329 Major depressive disorder, single episode, unspecified: Secondary | ICD-10-CM

## 2018-10-22 DIAGNOSIS — F32A Depression, unspecified: Secondary | ICD-10-CM

## 2018-10-22 DIAGNOSIS — N945 Secondary dysmenorrhea: Secondary | ICD-10-CM

## 2018-10-22 DIAGNOSIS — F419 Anxiety disorder, unspecified: Secondary | ICD-10-CM | POA: Diagnosis not present

## 2018-10-22 MED ORDER — NORGESTIMATE-ETH ESTRADIOL 0.25-35 MG-MCG PO TABS: 1.0000 | ORAL_TABLET | Freq: Every day | ORAL | 4 refills | Status: DC

## 2018-10-22 MED FILL — NORGESTIMATE-ETH ESTRADIOL: 0.25-35 | 84 days supply | Qty: 112 | Fill #0

## 2018-10-22 NOTE — Patient Instructions (Signed)
1. Irregular menses Irregular menstrual periods in a 48 year old woman who is underweight with a body mass index of 15.9 and multiple stressors resulting in anxiety and depression which are currently treated.  Pelvic exam today is normal and CT scan of the pelvis June 2020 showed a normal uterus and ovaries with no pelvic mass or free fluid.  Given her irregular periods, severe secondary dysmenorrhea and risk of osteoporosis with no contraindication to birth control pills, decision to start on Sprintec continuous use.  Benefits and risks reviewed.  Informed of usage for continuous use, will not take the placebo pills, will be taking the hormone pills every day without stopping to avoid her periods.  Prescription sent to pharmacy.  2. Secondary dysmenorrhea As above, will use birth control pills, Sprintec, continuous use to avoid her periods.  3. Malignant neoplasm of fundus of stomach ([facility]) Gastrectomy in 2019.  Followed by oncologist.  4. Underweight Referred to nutritionist, appointment scheduled for next week.  5. Anxiety and depression Under treatment.   Other orders - norgestimate-ethinyl estradiol (ORTHO-CYCLEN) 0.25-35 MG-MCG tablet; Take 1 tablet by mouth daily. Continuous use to avoid menses/dysmenorrhea  Mickel Baas, fue un placer encontrarle hoy!

## 2018-10-22 NOTE — Progress Notes (Signed)
Brooke Austin 19-May-1970 [id number]   History:    48 y.o. G0 married.  Yoga instructor.  From [place].  RP:  New patient presenting for irregular menstrual periods and dysmenorrhea x 1 year  HPI: Many stressors in the past 4 years.  Anxiety and Depression currently on Paxil, Restoril and Ultram.  Followed by a Psychotherapist.  Mother died of cancer in 06/18/17.  Shortly after, patient was diagnosed with Gastric Cancer and had a Gastrectomy.  Difficulty eating and absorbing. BMI 15.9.  Will see a Nutritionist next week.  Menses every 3 weeks to 3 months with moderate flow and severe dysmenorrhea.  Discomfort with IC, dryness.  CT scan of pelvis 08/2018 showed a normal uterus and ovaries, no mass and no FF.  Past medical history,surgical history, family history and social history were all reviewed and documented in the EPIC chart.  Gynecologic History Patient's last menstrual period was 10/16/2018. Contraception: H/O Primary infertility x many years, no contraception  Obstetric History OB History  Gravida Para Term Preterm AB Living  0 0 0 0 0 0  SAB TAB Ectopic Multiple Live Births  0 0 0 0 0     ROS: A ROS was performed and pertinent positives and negatives are included in the history.  GENERAL: No fevers or chills. HEENT: No change in vision, no earache, sore throat or sinus congestion. NECK: No pain or stiffness. CARDIOVASCULAR: No chest pain or pressure. No palpitations. PULMONARY: No shortness of breath, cough or wheeze. GASTROINTESTINAL: No abdominal pain, nausea, vomiting or diarrhea, melena or bright red blood per rectum. GENITOURINARY: No urinary frequency, urgency, hesitancy or dysuria. MUSCULOSKELETAL: No joint or muscle pain, no back pain, no recent trauma. DERMATOLOGIC: No rash, no itching, no lesions. ENDOCRINE: No polyuria, polydipsia, no heat or cold intolerance. No recent change in weight. HEMATOLOGICAL: No anemia or easy bruising or bleeding. NEUROLOGIC: No  headache, seizures, numbness, tingling or weakness. PSYCHIATRIC: No depression, no loss of interest in normal activity or change in sleep pattern.     Exam:   BP 112/70   Ht 5' 6.5" (1.689 m)   Wt 100 lb (45.4 kg)   LMP 10/16/2018   BMI 15.90 kg/m   Body mass index is 15.9 kg/m.  General appearance : Well developed well nourished female. No acute distress  Abdomen: no palpable masses or tenderness, no rebound or guarding  Extremities: no edema or skin discoloration or tenderness  Pelvic: Vulva: Normal             Vagina: No gross lesions or discharge  Cervix: No gross lesions or discharge  Uterus  AV, normal size, shape and consistency, non-tender and mobile  Adnexa  Without masses or tenderness  Anus: Normal  CT scan of pelvis 09/21/18: Reproductive: Uterus and adnexa unremarkable.  No mass. No free fluid.   Assessment/Plan:  48 y.o. female for annual exam   1. Irregular menses Irregular menstrual periods in a 49 year old woman who is underweight with a body mass index of 15.9 and multiple stressors resulting in anxiety and depression which are currently treated.  Pelvic exam today is normal and CT scan of the pelvis June 2020 showed a normal uterus and ovaries with no pelvic mass or free fluid.  Given her irregular periods, severe secondary dysmenorrhea and risk of osteoporosis with no contraindication to birth control pills, decision to start on Sprintec continuous use.  Benefits and risks reviewed.  Informed of usage for continuous use, will not take the  placebo pills, will be taking the hormone pills every day without stopping to avoid her periods.  Prescription sent to pharmacy.  2. Secondary dysmenorrhea As above, will use birth control pills, Sprintec, continuous use to avoid her periods.  3. Malignant neoplasm of fundus of stomach ([place]) Gastrectomy in 2019.  Followed by oncologist.  4. Underweight Referred to nutritionist, appointment scheduled for next week.  5.  Anxiety and depression Under treatment.   Other orders - norgestimate-ethinyl estradiol (ORTHO-CYCLEN) 0.25-35 MG-MCG tablet; Take 1 tablet by mouth daily. Continuous use to avoid menses/dysmenorrhea  Counseling on above issues and coordination of care more than 50% for 45 minutes.  Princess Bruins MD, 3:26 PM 10/22/2018

## 2018-10-26 ENCOUNTER — Telehealth: Payer: Self-pay | Admitting: General Practice

## 2018-10-26 NOTE — Telephone Encounter (Signed)
[place] CSW Progress Notes  Rescheduled tomorrow's phone session to 9/2 at patient request.  Edwyna Shell, LCSW Clinical Social Worker Phone:  [phone]

## 2018-10-26 NOTE — Progress Notes (Signed)
Nutrition  Patient called to cancel nutrition phone appointment for today (8/4).  She did not reschedule.    Quandarius Nill B. Zenia Resides, [place], [place] Registered Dietitian [phone] (pager)

## 2018-10-27 ENCOUNTER — Telehealth: Payer: Self-pay | Admitting: Family Medicine

## 2018-10-27 ENCOUNTER — Inpatient Hospital Stay: Payer: 59 | Admitting: General Practice

## 2018-10-27 NOTE — Telephone Encounter (Signed)
Faxed to the same number listed twice.

## 2018-10-27 NOTE — Telephone Encounter (Signed)
It looks like you faxed this already, but I could not tell if it was indeed to the same fax #. Do you mind refaxing the order to the fax # in this message?

## 2018-10-27 NOTE — Telephone Encounter (Signed)
Patient called asked if her information for (PT) can be faxed   Patient said she have an appointment for (PT) 11/02/2018  The fax# is [phone]

## 2018-10-28 ENCOUNTER — Telehealth: Payer: Self-pay | Admitting: Gastroenterology

## 2018-10-28 ENCOUNTER — Encounter: Payer: Self-pay | Admitting: General Practice

## 2018-10-28 ENCOUNTER — Telehealth: Payer: Self-pay | Admitting: Family Medicine

## 2018-10-28 ENCOUNTER — Inpatient Hospital Stay: Payer: 59 | Attending: Hematology | Admitting: General Practice

## 2018-10-28 ENCOUNTER — Ambulatory Visit: Payer: 59 | Admitting: Family Medicine

## 2018-10-28 ENCOUNTER — Other Ambulatory Visit: Payer: Self-pay

## 2018-10-28 ENCOUNTER — Encounter: Payer: Self-pay | Admitting: Family Medicine

## 2018-10-28 VITALS — BP 110/70 | HR 91 | Temp 98.9°F | Ht 68.0 in | Wt 99.2 lb

## 2018-10-28 DIAGNOSIS — G8929 Other chronic pain: Secondary | ICD-10-CM | POA: Diagnosis not present

## 2018-10-28 DIAGNOSIS — D509 Iron deficiency anemia, unspecified: Secondary | ICD-10-CM

## 2018-10-28 DIAGNOSIS — K5900 Constipation, unspecified: Secondary | ICD-10-CM

## 2018-10-28 DIAGNOSIS — G4700 Insomnia, unspecified: Secondary | ICD-10-CM | POA: Diagnosis not present

## 2018-10-28 DIAGNOSIS — Z85028 Personal history of other malignant neoplasm of stomach: Secondary | ICD-10-CM | POA: Diagnosis not present

## 2018-10-28 DIAGNOSIS — M549 Dorsalgia, unspecified: Secondary | ICD-10-CM

## 2018-10-28 DIAGNOSIS — F329 Major depressive disorder, single episode, unspecified: Secondary | ICD-10-CM

## 2018-10-28 DIAGNOSIS — F419 Anxiety disorder, unspecified: Secondary | ICD-10-CM | POA: Diagnosis not present

## 2018-10-28 DIAGNOSIS — K2270 Barrett's esophagus without dysplasia: Secondary | ICD-10-CM

## 2018-10-28 DIAGNOSIS — F32A Depression, unspecified: Secondary | ICD-10-CM

## 2018-10-28 NOTE — Progress Notes (Signed)
[place] CSW Progress Notes  Call to patient to provide support and resources.  Patient is struggling to find equilibrium to life after total gastrectomy.  Often tearful, overwhelmed by multiple issues.  Continues to struggle with sleep.  Has used Restoril for quite some time - concerned that PCP will not prescribe this, referred patient back to oncologist.  Oncologist messaged.  Framed patient's struggles in categories - thigs she can change vs things she cannot change.  Sleep identified as something she cannot change, however were able to identify areas that she can impact.  Discussed ways to use behavioral strategies for sleep hygiene, including disconnecting from screens before bedtime, hot shower, reading, listening to music.  Also problem solved what to do if she cannot fall asleep after 1 - 2 hours - decided she will get up and read nonstimulating book.  Agreed to connect with patient approx every two weeks in order to provide greater support.  Patient has not connected with [facility] dietitian due to concern about finances - now aware that these visits are no cost.  Patient states she will reconnect with dietitian.  Reports significant distress re constipation and weight loss post gastrectomy.  Is aware that her situation is very unique, has connected w gastric cancer support group however has not found many people in her situation.  Will continue to provide support as needed to patient.  Edwyna Shell, LCSW Clinical Social Worker Phone:  [phone]

## 2018-10-28 NOTE — Progress Notes (Signed)
Subjective:    Patient ID: Brooke Austin, female    DOB: 28-Jul-1970, 48 y.o.   MRN: [id number]  HPI Chief Complaint  Patient presents with  . Follow-up    back pain    . Constipation    severe   Here today for a follow up on multiple chronic health conditions.   She is still fairly new to the practice. I saw her for the first time on 09/22/2018. She is from [place] originally but has lived in the [place] for 20 years.   Previous medical care: [facility] providers. She is switching to [facility] due to her husband being a new hospitalist for [facility].  She has a complex medical history and has had several specialists in [facility] including GI and oncologist for gastric cancer, OB/GYN, and dermatologist. She will need new referral to these specialists in [place].   Her main concern today is constipation and chronic pain.  She is now under the care of Dr. Burr Medico, oncologist, Dr. Junius Roads, orthopedist and Dr. Dellis Filbert gynecologist.   After review of her chart, I explained that Dr. Junius Roads is referring her to pain management.   In regards to constipation, she reports taking Miralax 6 cap fulls a day. Dulcolax and Senna as well. Using a suppository prn.  States she had a small bowel movement this morning. Feels like something is keeping the stool from coming out. States she is unable to eat, appetite is not good. No vomiting.  Denies fever, chills, chest pain, palpitations, shortness of breath.   Of note, I referred her to [facility] early July and she has not heard from them. She does not want to go back to [facility] for her issues since her insurance is now [facility] since her husband is a Furniture conservator/restorer. My assistant will call [facility] to attempt to get her scheduled. Not sure why she has not heard from them.   She also requests that I refill mirtazapine and temazepam. States oncologist prescribed these for her for appetite and sleep. States she feels that the temazepam helps her more than the mirtazapine.   States she has stopped Paxil as recommended by Dr. Burr Medico.   No other concerns today.   States she is scheduled to speak with her therapist later today about anxiety and depression.   Denies fever, chills, dizziness, chest pain, palpitations, shortness of breath, abdominal pain, N/V/D, urinary symptoms, LE edema.     Review of Systems Pertinent positives and negatives in the history of present illness.     Objective:   Physical Exam BP 110/70   Pulse 91   Temp 98.9 F (37.2 C) (Oral)   Ht 5\' 8"  (1.727 m)   Wt 99 lb 3.2 oz (45 kg)   LMP 10/16/2018   SpO2 94%   BMI 15.08 kg/m   Alert and oriented and in no acute distress. Visibly uncomfortable. Anxious regarding medications being refilled for sleep.       Assessment & Plan:  Constipation, unspecified constipation type - Plan: she is currently on an aggressive regimen which was recommended by previous GI. No sign of obstruction. She did have output earlier today. Attempting to get her an appointment with [facility] now that she has [employer] and no longer has [employer]. The referral was made over one month ago. My office manager will check into why she has not heard from them as I find this unacceptable.   Anxiety and depression - Plan: she will see her therapist  later today.   Barrett's esophagus without dysplasia - Plan: will see GI  History of gastric cancer - Plan: managed by GI and now Dr. Burr Medico, oncologist.   Chronic back pain, unspecified back location, unspecified back pain laterality - Plan: under the care of Dr. Junius Roads who is working with her to manage her pain and has referred her to pain management.   Insomnia- discussed that Dr. Burr Medico initiated mirtazapine and she has a refill at her pharmacy. It appears that this is intended to help with appetite and sleep. She is also taking temazepam and I explained that I am uncomfortable prescribing her 2 different sleep medications. After speaking with my supervising  physician, I will leave this decision up to her oncologist who recently started her on the mirtazapine. Patient aware that she should consult with Dr. Burr Medico about taking these medications concomitantly.

## 2018-10-28 NOTE — Progress Notes (Unsigned)
[facility] CSW Progress Notes   

## 2018-10-28 NOTE — Telephone Encounter (Signed)
Patient called advised she is in a lot of pain. Patient asked if she can get something for the pain she is having in her back. Patient asked for a call back as soon as possible. The number to contact patient is [phone]

## 2018-10-28 NOTE — Telephone Encounter (Signed)
The patient finished the Medrol dosepack, Tramadol and the Baclofen. They did help some. She has an ESI scheduled for 11/04/2018 with Dr. Ernestina Patches. She is requesting refills on the pain medication & muscle relaxer. [employer]

## 2018-10-28 NOTE — Telephone Encounter (Signed)
DOD 09/27/18 Dr. Tarri Glenn,  This pt was referred for constipation and has a hx of gastric cancer.  She has GI hx at [facility] (records in [place] for review.)    Pt would like to transfer care due to insurance being out of network.  Will you accept this patient?

## 2018-10-28 NOTE — Telephone Encounter (Signed)
Called [facility] [phone] spoke with Colletta Maryland, she will have Hoy Register return call.  Referral was 09/22/18 and patient has not been contacted.    Pt husband is hospitalist at [facility] and they now have [facility].  [facility] of network and needs local GI.

## 2018-10-28 NOTE — Telephone Encounter (Signed)
Verdis Prime called back and states referral was closed as they read the record that showed pt was being seen at [facility].  No further investigation was made.  I explained we should have been notified if they were not going to accept the referral.  Pt has been called twice from [facility] today.

## 2018-10-29 ENCOUNTER — Telehealth: Payer: Self-pay | Admitting: Family Medicine

## 2018-10-29 ENCOUNTER — Encounter: Payer: Self-pay | Admitting: Gastroenterology

## 2018-10-29 ENCOUNTER — Other Ambulatory Visit: Payer: Self-pay | Admitting: Family Medicine

## 2018-10-29 DIAGNOSIS — G4700 Insomnia, unspecified: Secondary | ICD-10-CM

## 2018-10-29 MED ORDER — TEMAZEPAM 15 MG PO CAPS: 15.0000 mg | ORAL_CAPSULE | Freq: Every day | ORAL | 0 refills | Status: AC

## 2018-10-29 MED FILL — TEMAZEPAM 15 MG CAPSULE: 15 | 30 days supply | Qty: 30 | Fill #0

## 2018-10-29 MED FILL — traMADol HCL 50 MG TABS: 50 | 10 days supply | Qty: 30 | Fill #0

## 2018-10-29 MED FILL — CYANOCOBALAMIN 1,000 MCG/ML: 1000 | 30 days supply | Qty: 1 | Fill #1

## 2018-10-29 MED FILL — BACLOFEN 10 MG TABS: 10 | 10 days supply | Qty: 30 | Fill #0

## 2018-10-29 NOTE — Telephone Encounter (Signed)
I called and advised the patient tramadol and baclofen were sent in to her pharmacy.

## 2018-10-29 NOTE — Telephone Encounter (Signed)
Per Dr. Tarri Glenn, please schedule the patient but she would prefer that she continue to follow up with [facility] as well. Thanks.

## 2018-10-29 NOTE — Telephone Encounter (Signed)
OK. I reviewed her records. You may schedule an appointment with me. However,  I would prefer that she continue to follow-up with [facility], as well. Thank you.

## 2018-10-29 NOTE — Telephone Encounter (Signed)
Sent!

## 2018-10-29 NOTE — Telephone Encounter (Signed)
Spoke with her oncologist Dr. Burr Medico earlier today regarding patient's current medication regimen.  Dr. Burr Medico believes that the patient will benefit from mirtazapine in regards to appetite and insomnia.  The patient is also wanting to continue on temazepam for insomnia and has been taking this for several months.  Called and discussed with the patient that I am willing to give her a 30-day prescription of the temazepam but I am not comfortable continuing on the 2 sleep medications going forward.  Discussed this with Dr. Redmond School, my supervising physician, and it would be in the patient's best interest to stop the temazepam.  Patient verbalized she does not want to stop temazepam.  I advised her to schedule with psychiatry and she agrees to do so.  She does have a great deal anxiety and stress and is currently seeing a Education officer, museum.

## 2018-11-02 ENCOUNTER — Inpatient Hospital Stay: Payer: 59

## 2018-11-02 NOTE — Progress Notes (Signed)
Nutrition Assessment  Patient has cancelled RD appointment times 2.  Referred by Webb Silversmith, Social Worker patient concerned about financial cost of appointment with RD  ASSESSMENT:   48 year old female with gastric cancer.  S/p on 08/19/17 at [facility] total gastrectomy.   Spoke with patient via phone this am.  Patient reports many years of constipation issues.  Reports if she takes her medication like she is suppose to then she will have a bowel movement about 1 time per day.  Reports that she has tried all kinds of foods (higher fiber, lower fiber) to help relieve constipation without success.  Patient hard to keep on track during conversation.  Never answered RD's questions about what she eats in typical day.  Reports that she can eat a soup that she makes (includes meat, potatoes, water, salt, pepper and little bit of onion powder.  Also reports that she sometimes eats cookies and potato chips.  Talked about how she woke up with reflux and started vomiting.  Only has done this 3 times.  Concerned about continued weight loss.  Reports that after she takes the mirtazapine she feels hungry and wants to eat during the night.  Reports that she drinks about 1 orgain shake per day, has tried others and can't tolerate it.  Talks about how she may be lactose intolerant.     Nutrition Focused Physical Exam: deferred   Medications: prilosec, miralax, mirtazipine Vit B 12  Labs: reviewed   Anthropometrics:   Height: 68 inches Weight: 99 lb on 8/6 Noted weight of 106 lb on 6/29 BMI: 15  7% weight loss in the last 1 1/2 months   Estimated Energy Needs  Kcals: 1350-1575 calories Protein: 68-78 g Fluid: 1.5 L   NUTRITION DIAGNOSIS: Inadequate oral intake related to altered GI function, ?? Disordered eating as evidenced by 7% weight loss in the last 1 1/2 months    INTERVENTION:  Patient interested in foods containing calcium, magnesium and Vit D.  Handouts provided to patient.  Patient extremely  hard to stay focused. Patient wants to continue taking remeron as she feels it increases her appetite Patient wants to add higher calcium foods/snacks into diet.   Patient wants to continue taking miralax, dulcolax to help with constipation.  ?? If reglan would be an option for patient.   Encouraged patient to continue orgain shakes.  Provided samples of [employer] (higher calorie 325) to try. Patient will come and pick up today at registration  desk.   Encouraged small frequent meals.    MONITORING, EVALUATION, GOAL: Patient will increase calories and protein to prevent further weight loss   Next Visit: patient prefers phone follow-up on Sept 1  Vyla Pint B. Zenia Resides, [place], [place] Registered Dietitian [phone] (pager)

## 2018-11-03 DIAGNOSIS — M6281 Muscle weakness (generalized): Secondary | ICD-10-CM | POA: Diagnosis not present

## 2018-11-03 DIAGNOSIS — R262 Difficulty in walking, not elsewhere classified: Secondary | ICD-10-CM | POA: Diagnosis not present

## 2018-11-03 DIAGNOSIS — M545 Low back pain: Secondary | ICD-10-CM | POA: Diagnosis not present

## 2018-11-04 ENCOUNTER — Ambulatory Visit (INDEPENDENT_AMBULATORY_CARE_PROVIDER_SITE_OTHER): Payer: 59 | Admitting: Physical Medicine and Rehabilitation

## 2018-11-04 ENCOUNTER — Ambulatory Visit: Payer: Self-pay

## 2018-11-04 VITALS — BP 108/69 | HR 59

## 2018-11-04 DIAGNOSIS — M48062 Spinal stenosis, lumbar region with neurogenic claudication: Secondary | ICD-10-CM | POA: Diagnosis not present

## 2018-11-04 DIAGNOSIS — M5416 Radiculopathy, lumbar region: Secondary | ICD-10-CM | POA: Diagnosis not present

## 2018-11-04 MED ORDER — BETAMETHASONE SOD PHOS & ACET 6 (3-3) MG/ML IJ SUSP
12.0000 mg | Freq: Once | INTRAMUSCULAR | Status: AC
  Administered 2018-11-04: 12 mg

## 2018-11-04 MED FILL — MIRTAZAPINE 7.5 MG TABLET: 7.5 | 30 days supply | Qty: 30 | Fill #1

## 2018-11-04 NOTE — Procedures (Signed)
Lumbosacral Transforaminal Epidural Steroid Injection - Sub-Pedicular Approach with Fluoroscopic Guidance  Patient: Brooke Austin      Date of Birth: 1970/04/26 MRN: [id number] PCP: Girtha Rm, NP-C      Visit Date: 11/04/2018   Universal Protocol:    Date/Time: 11/04/2018  Consent Given By: the patient  Position: PRONE  Additional Comments: Vital signs were monitored before and after the procedure. Patient was prepped and draped in the usual sterile fashion. The correct patient, procedure, and site was verified.   Injection Procedure Details:  Procedure Site One Meds Administered:  Meds ordered this encounter  Medications  . betamethasone acetate-betamethasone sodium phosphate (CELESTONE) injection 12 mg    Laterality: Bilateral  Location/Site:  L4-L5  Needle size: 22 G  Needle type: Spinal  Needle Placement: Transforaminal  Findings:    -Comments: Excellent flow of contrast along the nerve and into the epidural space.  Procedure Details: After squaring off the end-plates to get a true AP view, the C-arm was positioned so that an oblique view of the foramen as noted above was visualized. The target area is just inferior to the "nose of the scotty dog" or sub pedicular. The soft tissues overlying this structure were infiltrated with 2-3 ml. of 1% Lidocaine without Epinephrine.  The spinal needle was inserted toward the target using a "trajectory" view along the fluoroscope beam.  Under AP and lateral visualization, the needle was advanced so it did not puncture dura and was located close the 6 O'Clock position of the pedical in AP tracterory. Biplanar projections were used to confirm position. Aspiration was confirmed to be negative for CSF and/or blood. A 1-2 ml. volume of Isovue-250 was injected and flow of contrast was noted at each level. Radiographs were obtained for documentation purposes.   After attaining the desired flow of contrast documented above, a  0.5 to 1.0 ml test dose of 0.25% Marcaine was injected into each respective transforaminal space.  The patient was observed for 90 seconds post injection.  After no sensory deficits were reported, and normal lower extremity motor function was noted,   the above injectate was administered so that equal amounts of the injectate were placed at each foramen (level) into the transforaminal epidural space.   Additional Comments:  The patient tolerated the procedure well Dressing: 2 x 2 sterile gauze and Band-Aid    Post-procedure details: Patient was observed during the procedure. Post-procedure instructions were reviewed.  Patient left the clinic in stable condition.

## 2018-11-04 NOTE — Progress Notes (Signed)
   Numeric Pain Rating Scale and Functional Assessment Average Pain (8)   In the last MONTH (on 0-10 scale) has pain interfered with the following?  1. General activity like being  able to carry out your everyday physical activities such as walking, climbing stairs, carrying groceries, or moving a chair?  Rating(9)   +Driver, -BT, -Dye Allergies.  

## 2018-11-08 DIAGNOSIS — M545 Low back pain: Secondary | ICD-10-CM | POA: Diagnosis not present

## 2018-11-08 DIAGNOSIS — M6281 Muscle weakness (generalized): Secondary | ICD-10-CM | POA: Diagnosis not present

## 2018-11-08 DIAGNOSIS — R262 Difficulty in walking, not elsewhere classified: Secondary | ICD-10-CM | POA: Diagnosis not present

## 2018-11-11 ENCOUNTER — Inpatient Hospital Stay: Payer: 59 | Admitting: General Practice

## 2018-11-11 ENCOUNTER — Encounter: Payer: Self-pay | Admitting: General Practice

## 2018-11-11 DIAGNOSIS — K9189 Other postprocedural complications and disorders of digestive system: Secondary | ICD-10-CM | POA: Diagnosis not present

## 2018-11-11 DIAGNOSIS — Z9884 Bariatric surgery status: Secondary | ICD-10-CM | POA: Diagnosis not present

## 2018-11-11 DIAGNOSIS — D509 Iron deficiency anemia, unspecified: Secondary | ICD-10-CM | POA: Diagnosis not present

## 2018-11-11 DIAGNOSIS — R112 Nausea with vomiting, unspecified: Secondary | ICD-10-CM | POA: Diagnosis not present

## 2018-11-11 DIAGNOSIS — K561 Intussusception: Secondary | ICD-10-CM | POA: Diagnosis not present

## 2018-11-11 DIAGNOSIS — E43 Unspecified severe protein-calorie malnutrition: Secondary | ICD-10-CM | POA: Diagnosis not present

## 2018-11-11 DIAGNOSIS — F419 Anxiety disorder, unspecified: Secondary | ICD-10-CM | POA: Diagnosis not present

## 2018-11-11 DIAGNOSIS — R636 Underweight: Secondary | ICD-10-CM | POA: Diagnosis not present

## 2018-11-11 DIAGNOSIS — Z681 Body mass index (BMI) 19 or less, adult: Secondary | ICD-10-CM | POA: Diagnosis not present

## 2018-11-11 DIAGNOSIS — Z3202 Encounter for pregnancy test, result negative: Secondary | ICD-10-CM | POA: Diagnosis not present

## 2018-11-11 DIAGNOSIS — R1084 Generalized abdominal pain: Secondary | ICD-10-CM | POA: Diagnosis not present

## 2018-11-11 DIAGNOSIS — Z434 Encounter for attention to other artificial openings of digestive tract: Secondary | ICD-10-CM | POA: Diagnosis not present

## 2018-11-11 DIAGNOSIS — G4700 Insomnia, unspecified: Secondary | ICD-10-CM | POA: Diagnosis not present

## 2018-11-11 DIAGNOSIS — Z85028 Personal history of other malignant neoplasm of stomach: Secondary | ICD-10-CM | POA: Diagnosis not present

## 2018-11-11 MED ORDER — MORPHINE SULFATE 4 MG/ML IJ SOLN
4.00 | INTRAMUSCULAR | Status: DC
Start: 2018-11-11 — End: 2018-11-11

## 2018-11-11 MED ORDER — EQUATE NICOTINE 4 MG MT GUM
4.00 | CHEWING_GUM | OROMUCOSAL | Status: DC
Start: 2018-11-11 — End: 2018-11-11

## 2018-11-11 NOTE — Progress Notes (Signed)
[place] CSW Progress Notes  Call to patient for scheduled counseling appointment - patient had just arrived by EMS to ED for episode of acute abdominal pain.  CSW will defer meeting and check in w patient tomorrow.  Oncologist advised.  Edwyna Shell, LCSW Clinical Social Worker Phone:  [phone]

## 2018-11-15 MED ORDER — GABAPENTIN 100 MG PO CAPS
100.00 | ORAL_CAPSULE | ORAL | Status: DC
Start: 2018-11-16 — End: 2018-11-15

## 2018-11-15 MED ORDER — MELATONIN 3 MG PO TABS
3.00 | ORAL_TABLET | ORAL | Status: DC
Start: 2018-11-16 — End: 2018-11-15

## 2018-11-15 MED ORDER — BACLOFEN 10 MG PO TABS
10.00 | ORAL_TABLET | ORAL | Status: DC
Start: ? — End: 2018-11-15

## 2018-11-15 MED ORDER — LACTATED RINGERS IV SOLN
INTRAVENOUS | Status: DC
Start: ? — End: 2018-11-15

## 2018-11-15 MED ORDER — SENNOSIDES-DOCUSATE SODIUM 8.6-50 MG PO TABS
1.00 | ORAL_TABLET | ORAL | Status: DC
Start: ? — End: 2018-11-15

## 2018-11-15 MED ORDER — GENERIC EXTERNAL MEDICATION
600.00 | Status: DC
Start: 2018-11-17 — End: 2018-11-15

## 2018-11-15 MED ORDER — TEMAZEPAM 15 MG PO CAPS
15.00 | ORAL_CAPSULE | ORAL | Status: DC
Start: ? — End: 2018-11-15

## 2018-11-15 MED ORDER — ONDANSETRON HCL 4 MG/2ML IJ SOLN
4.00 | INTRAMUSCULAR | Status: DC
Start: ? — End: 2018-11-15

## 2018-11-15 MED ORDER — BENZOCAINE-MENTHOL 6-10 MG MT LOZG
1.00 | LOZENGE | OROMUCOSAL | Status: DC
Start: ? — End: 2018-11-15

## 2018-11-15 MED ORDER — ENOXAPARIN SODIUM 40 MG/0.4ML ~~LOC~~ SOLN
40.00 | SUBCUTANEOUS | Status: DC
Start: 2018-11-17 — End: 2018-11-15

## 2018-11-15 MED ORDER — PHENOL 1.4 % MT LIQD
1.00 | OROMUCOSAL | Status: DC
Start: ? — End: 2018-11-15

## 2018-11-15 MED ORDER — GENERIC EXTERNAL MEDICATION
7.50 | Status: DC
Start: 2018-11-16 — End: 2018-11-15

## 2018-11-15 MED ORDER — POLYETHYLENE GLYCOL 3350 17 G PO PACK
17.00 | PACK | ORAL | Status: DC
Start: 2018-11-17 — End: 2018-11-15

## 2018-11-15 MED ORDER — BISACODYL 10 MG RE SUPP
10.00 | RECTAL | Status: DC
Start: ? — End: 2018-11-15

## 2018-11-15 MED ORDER — OXYCODONE HCL 5 MG PO TABS
5.00 | ORAL_TABLET | ORAL | Status: DC
Start: ? — End: 2018-11-15

## 2018-11-15 MED ORDER — DIPHENHYDRAMINE HCL 50 MG/ML IJ SOLN
12.50 | INTRAMUSCULAR | Status: DC
Start: ? — End: 2018-11-15

## 2018-11-16 MED FILL — ONDANSETRON ODT 4 MG TABLET: 4 | 5 days supply | Qty: 20 | Fill #0

## 2018-11-16 MED FILL — oxyCODONE HCL 10 MG TABS: 10 | 4 days supply | Qty: 42 | Fill #0

## 2018-11-18 ENCOUNTER — Other Ambulatory Visit: Payer: Self-pay | Admitting: *Deleted

## 2018-11-18 MED ORDER — BACLOFEN 10 MG PO TABS
10.00 | ORAL_TABLET | ORAL | Status: DC
Start: 2018-11-16 — End: 2018-11-18

## 2018-11-18 MED ORDER — OXYCODONE HCL 15 MG PO TABS
15.00 | ORAL_TABLET | ORAL | Status: DC
Start: ? — End: 2018-11-18

## 2018-11-18 MED FILL — BACLOFEN 10 MG TABS: 10 | 10 days supply | Qty: 30 | Fill #1

## 2018-11-18 NOTE — Patient Outreach (Addendum)
[place] [facility]) Care Management  11/18/2018  Brooke Austin Aug 02, 1970 [id number]  Referral received: 11/15/18 Initial outreach: 11/18/18 Insurance: [place]  Initial successful telephone call to patient's preferred (mobile) number in order to complete transition of care assessment; 2 HIPAA identifiers verified. Explained purpose of call, patient states she is in pain and does not feel like talking . She state she has the prescribed pain medication but it is not controlling the pain.   Objective: Per the electronic medical record, Brooke Austin was hospitalized at [facility] from 8/20-8/25 for shortness of breath and severe abdominal pain. On 8/20 she underwent exploratory laparoscopy with release of jejunal intussusception via jejunal anastomosis.  Comorbidities include: gastric cancer s/p total gastrectomy, Barrett's esophagus, anxiety, insomnia, iron deficiency anemia,  She was discharged to home on 11/16/18 without the need for home health services or durable medical equipment per the discharge summary.   Plan: Advised patient to call her surgeon and report unrelieved surgical pain, she voiced understanding and compliance and confirmed she has the surgeon's phone number. Per patient request, will call patient tomorrow around 12 noon to complete transition of care assessment.   Barrington Ellison RN,CCM,CDE [place] Management Coordinator Office Phone [phone] Office Fax [phone]

## 2018-11-19 ENCOUNTER — Ambulatory Visit: Payer: Self-pay | Admitting: *Deleted

## 2018-11-19 ENCOUNTER — Other Ambulatory Visit: Payer: Self-pay | Admitting: Family Medicine

## 2018-11-19 ENCOUNTER — Other Ambulatory Visit: Payer: Self-pay | Admitting: *Deleted

## 2018-11-19 NOTE — Patient Outreach (Addendum)
[place] [facility]) Care Management  11/19/2018  Winter Hineman December 12, 1970 [id number]   Referral received: 11/15/18 Initial outreach: 11/18/18 Insurance: [place]  Second unsuccessful telephone call to patient's preferred (home) number in order to complete transition of care assessment; no answer, left HIPAA compliant message requesting return call.   Objective: Per the electronic medical record, Brooke Austin was hospitalized at [facility] from 8/20-8/25 for shortness of breath and severe abdominal pain. On 8/20 she underwent exploratory laparoscopy with release of jejunal intussusception via jejunal anastomosis.  Comorbidities include: gastric cancer s/p total gastrectomy, Barrett's esophagus, anxiety, insomnia, iron deficiency anemia,  She was discharged to home on 11/16/18 without the need for home health services or durable medical equipment per the discharge summary.   Plan: If no return call from the patient by end of business day today, this RNCM will route unsuccessful outreach letter with [place] Management pamphlet and 24 hour Nurse Advice Line Magnet to [place] Management clinical pool to be mailed to patient's home address. This RNCM will attempt another outreach within 4 business days.  Barrington Ellison RN,CCM,CDE [place] Management Coordinator Office Phone [phone] Office Fax [phone]

## 2018-11-22 ENCOUNTER — Telehealth: Payer: Self-pay | Admitting: Physical Medicine and Rehabilitation

## 2018-11-22 NOTE — Telephone Encounter (Signed)
The injection images looked perfect, if she had very little relief then surgical consultation not a bad idea. She can also talk to Dr. Junius Roads for recs.  If here then Dr. Louanne Skye if neurosurgeon the [facility] Loffler or Kathyrn Sheriff.  If she had good relief but short duration not unreasonable to try second shot as well. Does not have to be either/ or.

## 2018-11-23 ENCOUNTER — Inpatient Hospital Stay: Payer: 59 | Attending: Hematology

## 2018-11-23 ENCOUNTER — Encounter: Payer: Commercial Managed Care - PPO | Admitting: Nutrition

## 2018-11-23 DIAGNOSIS — K644 Residual hemorrhoidal skin tags: Secondary | ICD-10-CM | POA: Diagnosis not present

## 2018-11-23 DIAGNOSIS — R627 Adult failure to thrive: Secondary | ICD-10-CM | POA: Diagnosis not present

## 2018-11-23 DIAGNOSIS — R112 Nausea with vomiting, unspecified: Secondary | ICD-10-CM | POA: Diagnosis not present

## 2018-11-23 DIAGNOSIS — K529 Noninfective gastroenteritis and colitis, unspecified: Secondary | ICD-10-CM | POA: Insufficient documentation

## 2018-11-23 DIAGNOSIS — R630 Anorexia: Secondary | ICD-10-CM | POA: Diagnosis not present

## 2018-11-23 DIAGNOSIS — R1084 Generalized abdominal pain: Secondary | ICD-10-CM | POA: Diagnosis not present

## 2018-11-23 DIAGNOSIS — R188 Other ascites: Secondary | ICD-10-CM | POA: Diagnosis not present

## 2018-11-23 DIAGNOSIS — K6289 Other specified diseases of anus and rectum: Secondary | ICD-10-CM | POA: Diagnosis not present

## 2018-11-23 DIAGNOSIS — M7989 Other specified soft tissue disorders: Secondary | ICD-10-CM | POA: Diagnosis not present

## 2018-11-23 DIAGNOSIS — Z23 Encounter for immunization: Secondary | ICD-10-CM | POA: Diagnosis not present

## 2018-11-23 NOTE — Telephone Encounter (Signed)
Left message #1

## 2018-11-23 NOTE — Progress Notes (Signed)
Nutrition  Called patient for nutrition follow-up.  No answer.  Left message with call back number.   Tiyana Galla B. Joanette Silveria, RD, LDN Registered Dietitian [phone] (pager)   

## 2018-11-24 ENCOUNTER — Inpatient Hospital Stay: Payer: 59 | Admitting: General Practice

## 2018-11-24 ENCOUNTER — Ambulatory Visit: Payer: Self-pay | Admitting: *Deleted

## 2018-11-24 ENCOUNTER — Other Ambulatory Visit: Payer: Self-pay | Admitting: *Deleted

## 2018-11-24 DIAGNOSIS — R188 Other ascites: Secondary | ICD-10-CM | POA: Diagnosis not present

## 2018-11-24 DIAGNOSIS — R627 Adult failure to thrive: Secondary | ICD-10-CM | POA: Diagnosis not present

## 2018-11-24 DIAGNOSIS — M7989 Other specified soft tissue disorders: Secondary | ICD-10-CM | POA: Diagnosis not present

## 2018-11-24 DIAGNOSIS — K644 Residual hemorrhoidal skin tags: Secondary | ICD-10-CM | POA: Diagnosis not present

## 2018-11-24 DIAGNOSIS — K6289 Other specified diseases of anus and rectum: Secondary | ICD-10-CM | POA: Diagnosis not present

## 2018-11-24 DIAGNOSIS — Z23 Encounter for immunization: Secondary | ICD-10-CM | POA: Diagnosis not present

## 2018-11-24 DIAGNOSIS — R112 Nausea with vomiting, unspecified: Secondary | ICD-10-CM | POA: Diagnosis not present

## 2018-11-24 DIAGNOSIS — K529 Noninfective gastroenteritis and colitis, unspecified: Secondary | ICD-10-CM | POA: Diagnosis not present

## 2018-11-24 DIAGNOSIS — R630 Anorexia: Secondary | ICD-10-CM | POA: Diagnosis not present

## 2018-11-24 NOTE — Patient Outreach (Signed)
[place] [facility]) Care Management  11/24/2018  Brooke Austin 1970-06-13 [id number]   Referral received:11/15/18 Initial outreach:11/18/18 Insurance: [facility]   Mrs. Goodie was due for 3rd transition of care outreach attempt today. In reviewing Care Everywhere, patient was readmitted to [facility] on 11/23/18 for enteritis, abdominal pain, dehydration and remains inpatient.  Objective: Layton Sozio initially hospitalized at [facility] from 8/20-8/25 for shortness of breath and severe abdominal pain. On 8/20 she underwent exploratory laparoscopy with release of jejunal intussusception via jejunal anastomosis.  Comorbidities include:gastric cancer s/p total gastrectomy, Barrett's esophagus, anxiety, insomnia, iron deficiency anemia, She was discharged to home on8/25/20 without the need for home health services or durable medical equipment per the discharge summary.   Plan:  Will monitor patient's status and call patient for transition of care assessment within 72 hours of discharge from acute acre.  Barrington Ellison RN,CCM,CDE [place] Management Coordinator Office Phone [phone] Office Fax [phone]

## 2018-11-25 ENCOUNTER — Encounter: Payer: Commercial Managed Care - PPO | Admitting: Nutrition

## 2018-11-25 DIAGNOSIS — M7989 Other specified soft tissue disorders: Secondary | ICD-10-CM | POA: Diagnosis not present

## 2018-11-25 DIAGNOSIS — R627 Adult failure to thrive: Secondary | ICD-10-CM | POA: Diagnosis not present

## 2018-11-25 DIAGNOSIS — R630 Anorexia: Secondary | ICD-10-CM | POA: Diagnosis not present

## 2018-11-25 DIAGNOSIS — K6289 Other specified diseases of anus and rectum: Secondary | ICD-10-CM | POA: Diagnosis not present

## 2018-11-25 DIAGNOSIS — Z23 Encounter for immunization: Secondary | ICD-10-CM | POA: Diagnosis not present

## 2018-11-25 DIAGNOSIS — R112 Nausea with vomiting, unspecified: Secondary | ICD-10-CM | POA: Diagnosis not present

## 2018-11-25 DIAGNOSIS — R188 Other ascites: Secondary | ICD-10-CM | POA: Diagnosis not present

## 2018-11-25 DIAGNOSIS — K529 Noninfective gastroenteritis and colitis, unspecified: Secondary | ICD-10-CM | POA: Diagnosis not present

## 2018-11-25 DIAGNOSIS — K644 Residual hemorrhoidal skin tags: Secondary | ICD-10-CM | POA: Diagnosis not present

## 2018-11-25 NOTE — Telephone Encounter (Signed)
Called patient's husband to advise.  

## 2018-11-26 ENCOUNTER — Other Ambulatory Visit: Payer: Self-pay | Admitting: *Deleted

## 2018-11-26 ENCOUNTER — Encounter: Payer: Self-pay | Admitting: *Deleted

## 2018-11-26 NOTE — Patient Outreach (Signed)
[facility]) Care Management  11/26/2018  Brooke Austin [birth year] [id number]  Transition of care call/case closure   Referral received:11/15/18 Initial outreach: 11/18/18 Insurance: [facility] Health Choice Plan   Subjective: Initial successful telephone call to patient's preferred number in order to complete transition of care assessment; 2 HIPAA identifiers verified. Explained purpose of call and completed transition of care assessment.  Mrs. Mongiello says she had to be readmitted to the hospital on 9/1-9/3 for pain management and hydration. She states she feels much better now except she is nauseated. She says her surgical incisions are unremarkable, denies bowel or bladder problems.  Spouse is assisting with her recovery.  She denies any ongoing health issues that would benefit from a referral to one of the [place] chronic disease management programs. She says she uses a [facility] outpatient pharmacy.  She denies educational needs related to staying safe during the COVID 19 pandemic.    Objective: Per the electronic medical record, Shawniqua Gladfelter was hospitalized at [facility] from 8/20-8/25 for shortness of breath and severe abdominal pain. On 8/20 she underwent exploratory laparoscopy with release of jejunal intussusception via jejunal anastomosis. She was readmitted on 9/1 for pain control and dehydration and was discharged to home on 11/25/18.  Comorbidities include: gastric cancer s/p total gastrectomy, Barrett's esophagus, anxiety, insomnia, iron deficiency anemia  Assessment:  Patient voices good understanding of all discharge instructions.  See transition of care flowsheet for assessment details.   Plan:  Reviewed hospital discharge diagnosis of bowel obstruction surgery and treatment plan using hospital discharge instructions, assessing medication adherence, reviewing problems requiring provider notification, and discussing the importance of  follow up with surgeon, primary care provider and specialists as directed. No ongoing care management needs identified so will close case to [place] Management services and route successful outreach letter with [place] Management pamphlet and 24 Hour Nurse Line Magnet to [place] Management clinical pool to be mailed to patient's home address.   Barrington Ellison RN,CCM,CDE [place] Management Coordinator Office Phone [phone] Office Fax [phone]

## 2018-11-26 NOTE — Telephone Encounter (Signed)
Patient's husband called back to advise that they would like to be referred to Dr. Saintclair Halsted.  Thank you.

## 2018-11-30 NOTE — Telephone Encounter (Signed)
Requesting referral to Dr. Saintclair Halsted.

## 2018-12-01 ENCOUNTER — Telehealth: Payer: Self-pay

## 2018-12-01 NOTE — Telephone Encounter (Signed)
Nutrition Follow-up:  Patient with gastric cancer.  S/p total gastrectomy at [facility] on 08/19/17.   Noted hospital admission at [facility] on 8/20 for severe shortness of breath, severe abdominal pain.  Noted exploratory lap with release of jejunal intusseception via jejunal anastomosis.  Noted re-admission on 9/1 for severe gas, reflux, spitting up/heaving and severe abdominal pain.      Received voicemail from patient returning RD's call that was left on 9/1. On voicemail patient was requesting information on full liquid diet.   RD was able to return patient's call this pm.  Patient reports that her intake has been limited.  She reports making homemade chicken broth, drank orgain shake and vomited 3 days ago so has not tried it again.  Reports ate [employer] french fries on Saturday and she got nauseated.  Reports that she continues to have pain.  Tearful during conversation saying that she is fearful to try foods.  Patient did not come and pick up [employer] that were left for her on 8/11.  She reports that she is unable to drive at this time due to pain medications.    RD notes from [facility] reviewed and noted severe malnutrition, NFPE reviewed. Noted MD diet to progress as tolerated.     Medications: reviewed  Labs: no new labs  Anthropometrics:   Weight noted on 9/1 at [facility] 95 lb 8 oz.  Weight at cancer center 99 lb 3.2 oz on 10/28/2018.     NUTRITION DIAGNOSIS: Inadequate oral intake continues    INTERVENTION:  Discussed full liquid diet per patient's request.  Patient reports may be lactose intolerant.  Discussed diary free options.  Encouraged trying bland, soft foods (potato without the skin, mashed potates, shredded chicken and rice/noodles in broth, etc) Encouraged small frequent meals.  Encouraged trying [employer] as well as orgain shakes again for added nutrition. Offered samples again but patient reports unable to drive at this time.   Clinical Social Worker notified of  patient being out of the hospital.   Patient has contact information    MONITORING, EVALUATION, GOAL: Patient will increase calories and protein to prevent further weight loss   NEXT VISIT: phone f/u Sept 30th  (Wednesday)  [place] B. Zenia Resides, [place], [place] Registered Dietitian [phone] (pager)

## 2018-12-02 ENCOUNTER — Other Ambulatory Visit: Payer: Self-pay | Admitting: Physical Medicine and Rehabilitation

## 2018-12-02 ENCOUNTER — Encounter: Payer: Self-pay | Admitting: General Practice

## 2018-12-02 DIAGNOSIS — M5416 Radiculopathy, lumbar region: Secondary | ICD-10-CM

## 2018-12-02 DIAGNOSIS — M48062 Spinal stenosis, lumbar region with neurogenic claudication: Secondary | ICD-10-CM

## 2018-12-02 NOTE — Progress Notes (Signed)
[place] CSW Progress Notes  Call to patient to set up appointment for supportive counseling.  Left VM w my contact information and requested return call.  Edwyna Shell, LCSW Clinical Social Worker Phone:  [phone] Cell:  [phone]

## 2018-12-02 NOTE — Progress Notes (Signed)
Brooke Austin - 48 y.o. female MRN [id number]  Date of birth: [birth year]  Office Visit Note: Visit Date: 11/04/2018 PCP: Girtha Rm, NP-C Referred by: Girtha Rm, NP-C  Subjective: Chief Complaint  Patient presents with  . Lower Back - Pain  . Right Leg - Pain  . Left Leg - Pain   HPI: Brooke Austin is a 48 y.o. female who comes in today For planned bilateral L4 transforaminal epidural steroid injection as requested by Dr. Legrand Como hilts.  She has been followed by Dr. Eunice Blase for chronic worsening severe low back pain referring into the buttocks and both legs in an L5 predominant distribution.  She reports the pain started about a year ago and is worse with bending and standing and walking.  Nothing really helps the pain at this point.  She has had activity modification home exercise program as well as baclofen tramadol and Zanaflex without much relief.  She rates her pain as an 8 out of 10.  She reports no specific focal weakness or trauma.  MRI of the lumbar spine was performed and this is reviewed below and reviewed with the patient today.  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  She has a complicated course of Barrett's esophagus and malignant neoplasm of the stomach.  It appears that Dr. Junius Roads also referred her to [facility] which would be the group with Dr. Letta Pate and Dr. Posey Pronto.  It looks like when they saw that we were doing an injection they denied the referral.  Depending on results and where she is out they may be an appropriate referral as I would defer all injections to them at that point.  Nonetheless she is here today and does need an injection to see if it will help her.  If she does not get very long lived results I think she would benefit being evaluated by a spine surgeon such as Dr. Louanne Skye in our office or a neurosurgeon.  Brief exam today shows good distal strength and patient ambulates without aid.   ROS Otherwise per HPI.  Assessment & Plan: Visit Diagnoses:  1. Lumbar radiculopathy   2. Spinal stenosis of lumbar region with neurogenic claudication     Plan: No additional findings.   Meds & Orders:  Meds ordered this encounter  Medications  . betamethasone acetate-betamethasone sodium phosphate (CELESTONE) injection 12 mg    Orders Placed This Encounter  Procedures  . XR C-ARM NO REPORT  . Epidural Steroid injection    Follow-up: Return if symptoms worsen or fail to improve.   Procedures: No procedures performed  Lumbosacral Transforaminal Epidural Steroid Injection - Sub-Pedicular Approach with Fluoroscopic Guidance  Patient: Brooke Austin      Date of Birth: 1970-11-08 MRN: [id number] PCP: Girtha Rm, NP-C      Visit Date: 11/04/2018   Universal Protocol:    Date/Time: 11/04/2018  Consent Given By: the patient  Position: PRONE  Additional Comments: Vital signs were monitored before and after the procedure. Patient was prepped and draped in the usual sterile fashion. The correct patient, procedure, and site was verified.   Injection Procedure Details:  Procedure Site One Meds Administered:  Meds ordered this encounter  Medications  . betamethasone acetate-betamethasone sodium phosphate (CELESTONE) injection 12 mg    Laterality: Bilateral  Location/Site:  L4-L5  Needle size: 22 G  Needle type: Spinal  Needle Placement: Transforaminal  Findings:    -Comments: Excellent flow  of contrast along the nerve and into the epidural space.  Procedure Details: After squaring off the end-plates to get a true AP view, the C-arm was positioned so that an oblique view of the foramen as noted above was visualized. The target area is just inferior to the "nose of the scotty dog" or sub pedicular. The soft tissues overlying this structure were infiltrated with 2-3 ml. of 1% Lidocaine without Epinephrine.  The spinal needle was inserted toward the target  using a "trajectory" view along the fluoroscope beam.  Under AP and lateral visualization, the needle was advanced so it did not puncture dura and was located close the 6 O'Clock position of the pedical in AP tracterory. Biplanar projections were used to confirm position. Aspiration was confirmed to be negative for CSF and/or blood. A 1-2 ml. volume of Isovue-250 was injected and flow of contrast was noted at each level. Radiographs were obtained for documentation purposes.   After attaining the desired flow of contrast documented above, a 0.5 to 1.0 ml test dose of 0.25% Marcaine was injected into each respective transforaminal space.  The patient was observed for 90 seconds post injection.  After no sensory deficits were reported, and normal lower extremity motor function was noted,   the above injectate was administered so that equal amounts of the injectate were placed at each foramen (level) into the transforaminal epidural space.   Additional Comments:  The patient tolerated the procedure well Dressing: 2 x 2 sterile gauze and Band-Aid    Post-procedure details: Patient was observed during the procedure. Post-procedure instructions were reviewed.  Patient left the clinic in stable condition.     Clinical History: MRI LUMBAR SPINE WITHOUT AND WITH CONTRAST  TECHNIQUE: Multiplanar and multiecho pulse sequences of the lumbar spine were obtained without and with intravenous contrast.  CONTRAST:  10 mL MULTIHANCE GADOBENATE DIMEGLUMINE 529 MG/ML IV SOLN  COMPARISON:  Plain films lumbar spine 09/18/2018.  FINDINGS: Segmentation:  Standard.  Alignment:  Normal.  Vertebrae: No fracture, evidence of discitis, or bone lesion. Degenerative endplate signal change 075-GRM noted.  Conus medullaris and cauda equina: Conus extends to the L1 level. Conus and cauda equina appear normal.  Paraspinal and other soft tissues: Negative.  Disc levels:  T11-12 and T12-L1 are imaged  in the sagittal plane only and negative.  L1-2: Negative.  L2-3: Negative.  L3-4: Mild facet degenerative change.  Otherwise negative.  L4-5: Moderate facet arthropathy, ligamentum flavum thickening and a disc bulge. Moderate to moderately severe central canal stenosis is present and there is left worse than right lateral recess narrowing. Foramina open. Small synovial cyst off the posterior aspects of both facet joints do not impact the central canal or foramina.  L5-S1: Mild-to-moderate facet arthropathy, shallow broad-based central protrusion and some ligamentum flavum thickening. Mild central canal and bilateral subarticular recess narrowing. Foramina are open.  IMPRESSION: Moderate to moderately severe central canal stenosis at L4-5 where there is left worse than right lateral recess narrowing.  Mild central canal and bilateral subarticular recess narrowing L5-S1.   Electronically Signed   By: Inge Rise M.D.   On: 10/18/2018 14:27   She reports that she has been smoking. She has a 5.00 pack-year smoking history. She has never used smokeless tobacco. No results for input(s): HGBA1C, LABURIC in the last 8760 hours.  Objective:  VS:  HT:    WT:   BMI:     BP:108/69  HR:(!) 59bpm  TEMP: ( )  RESP:  Physical  Exam  Ortho Exam Imaging: No results found.  Past Medical/Family/Surgical/Social History: Medications & Allergies reviewed per EMR, new medications updated. Patient Active Problem List   Diagnosis Date Noted  . Enteritis 11/23/2018  . Patient had 2 or more falls in past year 09/22/2018  . Acute midline low back pain without sciatica 09/22/2018  . Anxiety and depression 09/22/2018  . Low calcium levels 08/12/2018  . Anxiety 08/04/2018  . Insomnia 08/04/2018  . Ovarian mass, left 03/02/2018  . Constipation 02/26/2018  . Left lower quadrant abdominal pain 02/26/2018  . Generalized abdominal pain 02/26/2018  . Underweight 01/01/2018  .  Encounter for follow-up surveillance of malignant neoplasm of stomach 12/08/2017  . Iron deficiency anemia 09/08/2017  . 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 Barrett's esophagus without dysplasia 07/27/2017  . Family history of gastric cancer 07/27/2017  . Malignant neoplasm of stomach ([place]) 07/27/2017  . Helicobacter pylori gastritis 07/27/2017  . Epigastric pain 07/27/2017   Past Medical History:  Diagnosis Date  . 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 Colitis    Family History  Problem Relation Age of Onset  . Stomach cancer Mother 95  . Cancer Maternal Aunt 40       breast cancer   . Stomach cancer Maternal Grandmother 7  . Stomach cancer Other    Past Surgical History:  Procedure Laterality Date  . breast surgery    . GASTRECTOMY  2019   Social History   Occupational History  . Not on file  Tobacco Use  . Smoking status: Current Every Day Smoker    Packs/day: 0.25    Years: 20.00    Pack years: 5.00  . Smokeless tobacco: Never Used  Substance and Sexual Activity  . Alcohol use: Not Currently  . Drug use: Not Currently  . Sexual activity: Yes

## 2018-12-02 NOTE — Progress Notes (Signed)
Referral to Dr. Saintclair Halsted per patient request. Talked with Dr. Junius Roads as well.

## 2018-12-02 NOTE — Telephone Encounter (Signed)
Referral sent to Dr. Saintclair Halsted.  Please make sure he gets to the right que.  I did speak with Dr. Junius Roads and he was okay with [place] making the referral.

## 2018-12-03 ENCOUNTER — Other Ambulatory Visit: Payer: Self-pay | Admitting: Family Medicine

## 2018-12-03 DIAGNOSIS — K649 Unspecified hemorrhoids: Secondary | ICD-10-CM | POA: Diagnosis not present

## 2018-12-03 DIAGNOSIS — M7989 Other specified soft tissue disorders: Secondary | ICD-10-CM | POA: Diagnosis not present

## 2018-12-03 DIAGNOSIS — C163 Malignant neoplasm of pyloric antrum: Secondary | ICD-10-CM | POA: Diagnosis not present

## 2018-12-03 DIAGNOSIS — Z681 Body mass index (BMI) 19 or less, adult: Secondary | ICD-10-CM | POA: Diagnosis not present

## 2018-12-03 DIAGNOSIS — R636 Underweight: Secondary | ICD-10-CM | POA: Diagnosis not present

## 2018-12-03 DIAGNOSIS — E8809 Other disorders of plasma-protein metabolism, not elsewhere classified: Secondary | ICD-10-CM | POA: Diagnosis not present

## 2018-12-03 MED FILL — oxyCODONE HCL 10 MG TABS: 10 | 3 days supply | Qty: 30 | Fill #0

## 2018-12-04 MED FILL — HYDROCORTISONE AC 25 MG SUP: 25 | 6 days supply | Qty: 12 | Fill #0

## 2018-12-04 MED FILL — BACLOFEN 10 MG TABS: 10 | 20 days supply | Qty: 60 | Fill #0

## 2018-12-06 DIAGNOSIS — C163 Malignant neoplasm of pyloric antrum: Secondary | ICD-10-CM | POA: Diagnosis not present

## 2018-12-06 DIAGNOSIS — R1084 Generalized abdominal pain: Secondary | ICD-10-CM | POA: Diagnosis not present

## 2018-12-07 ENCOUNTER — Emergency Department (HOSPITAL_COMMUNITY): Payer: 59

## 2018-12-07 ENCOUNTER — Other Ambulatory Visit: Payer: Self-pay

## 2018-12-07 ENCOUNTER — Emergency Department (HOSPITAL_COMMUNITY)
Admission: EM | Admit: 2018-12-07 | Discharge: 2018-12-07 | Disposition: A | Discharge status: Home / Self Care | Payer: 59 | Attending: Emergency Medicine | Admitting: Emergency Medicine

## 2018-12-07 DIAGNOSIS — G589 Mononeuropathy, unspecified: Secondary | ICD-10-CM | POA: Diagnosis not present

## 2018-12-07 DIAGNOSIS — R531 Weakness: Secondary | ICD-10-CM | POA: Diagnosis not present

## 2018-12-07 DIAGNOSIS — R600 Localized edema: Secondary | ICD-10-CM | POA: Diagnosis not present

## 2018-12-07 DIAGNOSIS — R200 Anesthesia of skin: Secondary | ICD-10-CM | POA: Diagnosis not present

## 2018-12-07 DIAGNOSIS — M4802 Spinal stenosis, cervical region: Secondary | ICD-10-CM | POA: Diagnosis not present

## 2018-12-07 DIAGNOSIS — Z79899 Other long term (current) drug therapy: Secondary | ICD-10-CM | POA: Insufficient documentation

## 2018-12-07 DIAGNOSIS — F17210 Nicotine dependence, cigarettes, uncomplicated: Secondary | ICD-10-CM | POA: Insufficient documentation

## 2018-12-07 DIAGNOSIS — M6281 Muscle weakness (generalized): Secondary | ICD-10-CM | POA: Diagnosis not present

## 2018-12-07 DIAGNOSIS — R29898 Other symptoms and signs involving the musculoskeletal system: Secondary | ICD-10-CM

## 2018-12-07 DIAGNOSIS — Z85020 Personal history of malignant carcinoid tumor of stomach: Secondary | ICD-10-CM | POA: Insufficient documentation

## 2018-12-07 DIAGNOSIS — M50222 Other cervical disc displacement at C5-C6 level: Secondary | ICD-10-CM | POA: Diagnosis not present

## 2018-12-07 LAB — DIFFERENTIAL
Abs Immature Granulocytes: 0.04 10*3/uL (ref 0.00–0.07)
Basophils Absolute: 0 10*3/uL (ref 0.0–0.1)
Basophils Relative: 1 %
Eosinophils Absolute: 0.1 10*3/uL (ref 0.0–0.5)
Eosinophils Relative: 1 %
Immature Granulocytes: 1 %
Lymphocytes Relative: 17 %
Lymphs Abs: 1.4 10*3/uL (ref 0.7–4.0)
Monocytes Absolute: 0.5 10*3/uL (ref 0.1–1.0)
Monocytes Relative: 6 %
Neutro Abs: 6.5 10*3/uL (ref 1.7–7.7)
Neutrophils Relative %: 74 %

## 2018-12-07 LAB — COMPREHENSIVE METABOLIC PANEL
ALT: 23 U/L (ref 0–44)
AST: 29 U/L (ref 15–41)
Albumin: 1.7 g/dL — ABNORMAL LOW (ref 3.5–5.0)
Alkaline Phosphatase: 83 U/L (ref 38–126)
Anion gap: 8 (ref 5–15)
BUN: 13 mg/dL (ref 6–20)
CO2: 26 mmol/L (ref 22–32)
Calcium: 7.7 mg/dL — ABNORMAL LOW (ref 8.9–10.3)
Chloride: 101 mmol/L (ref 98–111)
Creatinine, Ser: 0.71 mg/dL (ref 0.44–1.00)
GFR calc Af Amer: >60 mL/min (ref >60)
GFR calc non Af Amer: >60 mL/min (ref >60)
Glucose, Bld: 99 mg/dL (ref 70–99)
Potassium: 4.2 mmol/L (ref 3.5–5.1)
Sodium: 135 mmol/L (ref 135–145)
Total Bilirubin: 0.2 mg/dL — ABNORMAL LOW (ref 0.3–1.2)
Total Protein: 4.4 g/dL — ABNORMAL LOW (ref 6.5–8.1)

## 2018-12-07 LAB — CBC
HCT: 30.2 % — ABNORMAL LOW (ref 36.0–46.0)
Hemoglobin: 9.9 g/dL — ABNORMAL LOW (ref 12.0–15.0)
MCH: 31.8 pg (ref 26.0–34.0)
MCHC: 32.8 g/dL (ref 30.0–36.0)
MCV: 97.1 fL (ref 80.0–100.0)
Platelets: 805 10*3/uL — ABNORMAL HIGH (ref 150–400)
RBC: 3.11 MIL/uL — ABNORMAL LOW (ref 3.87–5.11)
RDW: 13.2 % (ref 11.5–15.5)
WBC: 8.6 10*3/uL (ref 4.0–10.5)
nRBC: 0 % (ref 0.0–0.2)

## 2018-12-07 LAB — I-STAT CHEM 8, ED
BUN: 13 mg/dL (ref 6–20)
Calcium, Ion: 1.07 mmol/L — ABNORMAL LOW (ref 1.15–1.40)
Chloride: 100 mmol/L (ref 98–111)
Creatinine, Ser: 0.6 mg/dL (ref 0.44–1.00)
Glucose, Bld: 96 mg/dL (ref 70–99)
HCT: 28 % — ABNORMAL LOW (ref 36.0–46.0)
Hemoglobin: 9.5 g/dL — ABNORMAL LOW (ref 12.0–15.0)
Potassium: 4.2 mmol/L (ref 3.5–5.1)
Sodium: 135 mmol/L (ref 135–145)
TCO2: 28 mmol/L (ref 22–32)

## 2018-12-07 LAB — APTT: aPTT: 31 seconds (ref 24–36)

## 2018-12-07 LAB — I-STAT BETA HCG BLOOD, ED (MC, WL, AP ONLY): I-stat hCG, quantitative: 5.5 m[IU]/mL — ABNORMAL HIGH (ref <5)

## 2018-12-07 LAB — PROTIME-INR
INR: 1.2 (ref 0.8–1.2)
Prothrombin Time: 15.1 seconds (ref 11.4–15.2)

## 2018-12-07 MED ORDER — METHYLPREDNISOLONE 4 MG PO TBPK: ORAL_TABLET | ORAL | 0 refills | Status: DC

## 2018-12-07 MED ORDER — SODIUM CHLORIDE 0.9% FLUSH
3.0000 mL | Freq: Once | INTRAVENOUS | Status: DC
Start: 2018-12-07 — End: 2018-12-07

## 2018-12-07 MED FILL — METHYLPREDNISOLONE 4 MG TBP: 4 | 6 days supply | Qty: 21 | Fill #0

## 2018-12-07 NOTE — ED Provider Notes (Signed)
[facility] EMERGENCY DEPARTMENT Provider Note   CSN: [id number] Arrival date & time: 12/07/18  [id number]     History   Chief Complaint Chief Complaint  Patient presents with   Neurologic Problem    HPI Brooke Austin is a 48 y.o. female.     Patient with history of gastric cancer who presents the ED with left arm weakness.  Patient went to bed last night around 10 or 11:00 with out this weakness and woke up with left arm weakness mostly at the left wrist.  Did take a sleeping pill.  Currently having issues with diet due to recent gastric cancer, colitis, intussusception.  Has swelling in the legs chronically from low albumin.  Patient denies any trauma.  Does not have any headache or neck pain.  Has not had any vision problems, speech problems.  The history is provided by the patient and the spouse.  Neurologic Problem This is a new problem. The current episode started 6 to 12 hours ago. The problem occurs constantly. The problem has not changed since onset.Pertinent negatives include no chest pain, no abdominal pain, no headaches and no shortness of breath. Nothing aggravates the symptoms. Nothing relieves the symptoms. She has tried nothing for the symptoms. The treatment provided no relief.    Past Medical History:  Diagnosis Date   Cancer ([place]) 2019   Gastric Stage 1A   Colitis     Patient Active Problem List   Diagnosis Date Noted   Enteritis 11/23/2018   Patient had 2 or more falls in past year 09/22/2018   Acute midline low back pain without sciatica 09/22/2018   Anxiety and depression 09/22/2018   Low calcium levels 08/12/2018   Anxiety 08/04/2018   Insomnia 08/04/2018   Ovarian mass, left 03/02/2018   Constipation 02/26/2018   Left lower quadrant abdominal pain 02/26/2018   Generalized abdominal pain 02/26/2018   Underweight 01/01/2018   Encounter for follow-up surveillance of malignant neoplasm of stomach 12/08/2017   Iron  deficiency anemia 09/08/2017   Tobacco abuse, 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 Authorizing Provider  baclofen (LIORESAL) 10 MG tablet Take 10 mg by mouth 3 (three) times daily.  11/25/18  Yes [provider]  bisacodyl (DULCOLAX) 10 MG suppository Place 10 mg rectally daily as needed for mild constipation or moderate constipation.  08/06/18  Yes [provider]  cyanocobalamin (,VITAMIN B-12,) 1000 MCG/ML injection Inject 1 mL (1,000 mcg total) into the muscle every 30 (thirty) days. 10/04/18  Yes Truitt Merle, MD  norgestimate-ethinyl estradiol (ORTHO-CYCLEN) 0.25-35 MG-MCG tablet Take 1 tablet by mouth daily. Continuous use to avoid menses/dysmenorrhea 10/22/18  Yes Princess Bruins, MD  ondansetron (ZOFRAN-ODT) 4 MG disintegrating tablet Take 4 mg by mouth every 8 (eight) hours as needed for nausea or vomiting.  11/25/18  Yes [provider]  Oxycodone HCl 10 MG TABS Take 10 mg by mouth as needed (pain).  12/03/18  Yes [provider]  polyethylene glycol (MIRALAX / GLYCOLAX) 17 g packet Take 17 g by mouth 2 (two) times a day.   Yes [provider]  senna-docusate (SENOKOT-S) 8.6-50 MG tablet Take 1 tablet by mouth at bedtime as needed for mild constipation.  03/03/18 11/12/19 Yes [provider]  temazepam (RESTORIL) 15 MG capsule Take 1 capsule (15 mg total) by  mouth at bedtime. 10/29/18  Yes Henson, Vickie L, NP-C  baclofen (LIORESAL) 10 MG tablet TAKE 1/2 TO 1 TABLET (5-10 MG TOTAL) BY MOUTH 3 (THREE) TIMES DAILY AS NEEDED FOR MUSCLE SPASMS. Patient not taking: Reported on 12/07/2018 10/29/18   Hilts, Legrand Como, MD  methylPREDNISolone (MEDROL DOSEPAK) 4 MG TBPK tablet Follow package insert instructions 12/07/18   Lennice Sites, DO  mirtazapine (REMERON) 7.5 MG tablet Take 1 tablet (7.5 mg total) by mouth at bedtime. Patient not taking: Reported on 12/07/2018 10/04/18   Truitt Merle, MD  Syringe/Needle, Disp, (SYRINGE 3CC/23GX1") 23G X 1" 3 ML MISC 1 Syringe by Does not apply route every 30 (thirty) days. 10/04/18   Truitt Merle, MD  traMADol (ULTRAM) 50 MG tablet Take 1 tablet (50 mg total) by mouth 3 (three) times daily as needed. Patient not taking: Reported on 12/07/2018 10/29/18   Hilts, Legrand Como, MD    Family History Family History  Problem Relation Age of Onset   Stomach cancer Mother 29   Cancer Maternal Aunt 47       breast cancer    Stomach cancer Maternal Grandmother 65   Stomach cancer Other     Social History Social History   Tobacco Use   Smoking status: Current Every Day Smoker    Packs/day: 0.25    Years: 20.00    Pack years: 5.00   Smokeless tobacco: Never Used  Substance Use Topics   Alcohol use: Not Currently   Drug use: Not Currently     Allergies   Melatonin and Zolpidem   Review of Systems Review of Systems  Constitutional: Negative for chills and fever.  HENT: Negative for ear pain and sore throat.   Eyes: Negative for pain and visual disturbance.  Respiratory: Negative for cough and shortness of breath.   Cardiovascular: Negative for chest pain and palpitations.  Gastrointestinal: Negative for abdominal pain and vomiting.  Genitourinary: Negative for dysuria and hematuria.  Musculoskeletal: Negative for arthralgias, back pain, gait problem, joint swelling, myalgias, neck pain and neck stiffness.  Skin: Negative  for color change and rash.  Neurological: Positive for weakness and numbness. Negative for dizziness, tremors, seizures, syncope, facial asymmetry, speech difficulty, light-headedness and headaches.  All other systems reviewed and are negative.    Physical Exam Updated Vital Signs  ED Triage Vitals  Enc Vitals Group     BP 12/07/18 0823 114/70     Pulse Rate 12/07/18 0823 62     Resp 12/07/18 0823 18     Temp 12/07/18 0823 97.9 F (36.6 C)     Temp Source 12/07/18 0823 Oral     SpO2 12/07/18 0823 100 %     Weight --      Height --      Head Circumference --  Peak Flow --      Pain Score 12/07/18 0818 5     Pain Loc --      Pain Edu? --      Excl. in [place]? --     Physical Exam Vitals signs and nursing note reviewed.  Constitutional:      General: She is not in acute distress.    Appearance: She is well-developed. She is ill-appearing (chronic, cachetic ).  HENT:     Head: Normocephalic and atraumatic.     Nose: Nose normal.     Mouth/Throat:     Mouth: Mucous membranes are moist.  Eyes:     Extraocular Movements: Extraocular movements intact.     Conjunctiva/sclera: Conjunctivae normal.     Pupils: Pupils are equal, round, and reactive to light.     Comments: Decreased vision in the left eye at baseline, no visual field deficits are present in the right eye  Neck:     Musculoskeletal: Neck supple.  Cardiovascular:     Rate and Rhythm: Normal rate and regular rhythm.     Pulses: Normal pulses.     Heart sounds: Normal heart sounds. No murmur.  Pulmonary:     Effort: Pulmonary effort is normal. No respiratory distress.     Breath sounds: Normal breath sounds.  Abdominal:     General: There is no distension.     Palpations: Abdomen is soft.     Tenderness: There is no abdominal tenderness.  Musculoskeletal:        General: No tenderness.     Right lower leg: Edema (2+) present.     Left lower leg: Edema (2+) present.  Skin:    General: Skin is warm and dry.       Capillary Refill: Capillary refill takes less than 2 seconds.  Neurological:     Mental Status: She is alert and oriented to person, place, and time.     Sensory: Sensory deficit present.     Motor: Weakness present.     Comments: Patient with 5+ out of 5 strength throughout except for in left upper extremity where she appears to have weakness in the left wrist flexion and extension which appears to be 0 out of 5, has some weakness in left elbow flexion and extension and shoulder shrug but overall poor effort, has some decreased sensation mostly along radial.median nerve distribution from the elbow down on the left but somewhat patchy but otherwise sensation appears grossly intact, no right-sided drift, normal right-sided finger-to-nose finger, patient is able to raise the left arm at the left shoulder but overall is unable to participate in finger-to-nose finger on the left side or participate in drift  Psychiatric:        Mood and Affect: Mood normal.      ED Treatments / Results  Labs (all labs ordered are listed, but only abnormal results are displayed) Labs Reviewed  CBC - Abnormal; Notable for the following components:      Result Value   RBC 3.11 (*)    Hemoglobin 9.9 (*)    HCT 30.2 (*)    Platelets 805 (*)    All other components within normal limits  COMPREHENSIVE METABOLIC PANEL - Abnormal; Notable for the following components:   Calcium 7.7 (*)    Total Protein 4.4 (*)    Albumin 1.7 (*)    Total Bilirubin 0.2 (*)    All other components within normal limits  I-STAT CHEM 8, ED - Abnormal;  Notable for the following components:   Calcium, Ion 1.07 (*)    Hemoglobin 9.5 (*)    HCT 28.0 (*)    All other components within normal limits  I-STAT BETA HCG BLOOD, ED (MC, WL, AP ONLY) - Abnormal; Notable for the following components:   I-stat hCG, quantitative 5.5 (*)    All other components within normal limits  PROTIME-INR  APTT  DIFFERENTIAL  URINALYSIS, ROUTINE W  REFLEX MICROSCOPIC    EKG EKG Interpretation  Date/Time:  Tuesday December 07 2018 09:56:37 EDT Ventricular Rate:  55 PR Interval:    QRS Duration: 92 QT Interval:  441 QTC Calculation: 422 R Axis:   78 Text Interpretation:  Sinus rhythm Confirmed by Lennice Sites [phone]) on 12/07/2018 10:05:25 AM   Radiology Ct Head Wo Contrast  Result Date: 12/07/2018 CLINICAL DATA:  Left upper extremity weakness. History of gastric carcinoma EXAM: CT HEAD WITHOUT CONTRAST TECHNIQUE: Contiguous axial images were obtained from the base of the skull through the vertex without intravenous contrast. COMPARISON:  None. FINDINGS: Brain: The ventricles are normal in size and configuration. There is no intracranial mass, hemorrhage, extra-axial fluid collection, or midline shift. The brain parenchyma appears unremarkable. No evident acute infarct. Vascular: No hyperdense vessel. There is mild calcification in each carotid siphon region. Skull: The bony calvarium appears intact. Sinuses/Orbits: There is mucosal thickening in several ethmoid air cells. Other visualized paranasal sinuses are clear. Orbits appear symmetric bilaterally. Other: Mastoid air cells are clear. IMPRESSION: Brain parenchyma appears unremarkable.  No mass or hemorrhage. There is mild arterial vascular calcification in the carotid siphon regions. There is mucosal thickening in several ethmoid air cells. Electronically Signed   By: Lowella Grip III M.D.   On: 12/07/2018 09:21   Mr Brain Wo Contrast (neuro Protocol)  Result Date: 12/07/2018 CLINICAL DATA:  Radiculopathy. Left hand weakness. History of gastric cancer status post gastrectomy in 2019. EXAM: MRI HEAD WITHOUT CONTRAST MRI CERVICAL SPINE WITHOUT CONTRAST TECHNIQUE: Multiplanar, multiecho pulse sequences of the brain and surrounding structures, and cervical spine, to include the craniocervical junction and cervicothoracic junction, were obtained without intravenous contrast.  COMPARISON:  Head CT 12/07/2018 FINDINGS: MRI HEAD FINDINGS Brain: There is no evidence of acute infarct, intracranial hemorrhage, intracranial mass effect, or extra-axial fluid collection. The ventricles and sulci are normal. A homogeneously T1 hyperintense lesion in the posterior aspect of the pituitary gland measures 10 x 4 x 6 mm without suprasellar extension or regional mass effect. The brain is otherwise normal in signal. Vascular: Major intracranial vascular flow voids are preserved. Skull and upper cervical spine: Unremarkable bone marrow signal. Sinuses/Orbits: Unremarkable orbits. Paranasal sinuses and mastoid air cells are clear. Other: None. MRI CERVICAL SPINE FINDINGS Alignment: Straightening of the normal cervical lordosis. No listhesis. Vertebrae: No fracture or suspicious marrow lesion. Mild multilevel degenerative endplate changes including mild edema, greatest at C6-7. Cord: Normal signal and morphology. Posterior Fossa, vertebral arteries, paraspinal tissues: Unremarkable. Disc levels: C2-3: Negative. C3-4: Uncovertebral spurring and minimal disc bulging without stenosis. C4-5: Minimal disc bulging and uncovertebral spurring without stenosis. C5-6: Minimal disc bulging and uncovertebral spurring without stenosis. C6-7: Disc bulging and uncovertebral spurring asymmetric to the left result in mild to moderate left neural foraminal stenosis at C6-7 without spinal stenosis. C7-T1: Negative. IMPRESSION: 1. No acute intracranial abnormality. 2. 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 No further imaging evaluation or follow-up is necessary. Consider endocrine function tests and correlate for history of pituitary hypersecretion.  This follows ACR consensus guidelines: Management of Incidental Pituitary Findings on CT, MRI and F18-FDG PET: A White Paper of the ACR Incidental Findings Committee. J Am Coll Radiol 2018; 15: 966-72. 3. Mild cervical disc degeneration, greatest  at C6-7 where there is mild-to-moderate left neural foraminal stenosis. No spinal stenosis. Electronically Signed   By: Logan Bores M.D.   On: 12/07/2018 12:25   Mr Cervical Spine Wo Contrast  Result Date: 12/07/2018 CLINICAL DATA:  Radiculopathy. Left hand weakness. History of gastric cancer status post gastrectomy in 2019. EXAM: MRI HEAD WITHOUT CONTRAST MRI CERVICAL SPINE WITHOUT CONTRAST TECHNIQUE: Multiplanar, multiecho pulse sequences of the brain and surrounding structures, and cervical spine, to include the craniocervical junction and cervicothoracic junction, were obtained without intravenous contrast. COMPARISON:  Head CT 12/07/2018 FINDINGS: MRI HEAD FINDINGS Brain: There is no evidence of acute infarct, intracranial hemorrhage, intracranial mass effect, or extra-axial fluid collection. The ventricles and sulci are normal. A homogeneously T1 hyperintense lesion in the posterior aspect of the pituitary gland measures 10 x 4 x 6 mm without suprasellar extension or regional mass effect. The brain is otherwise normal in signal. Vascular: Major intracranial vascular flow voids are preserved. Skull and upper cervical spine: Unremarkable bone marrow signal. Sinuses/Orbits: Unremarkable orbits. Paranasal sinuses and mastoid air cells are clear. Other: None. MRI CERVICAL SPINE FINDINGS Alignment: Straightening of the normal cervical lordosis. No listhesis. Vertebrae: No fracture or suspicious marrow lesion. Mild multilevel degenerative endplate changes including mild edema, greatest at C6-7. Cord: Normal signal and morphology. Posterior Fossa, vertebral arteries, paraspinal tissues: Unremarkable. Disc levels: C2-3: Negative. C3-4: Uncovertebral spurring and minimal disc bulging without stenosis. C4-5: Minimal disc bulging and uncovertebral spurring without stenosis. C5-6: Minimal disc bulging and uncovertebral spurring without stenosis. C6-7: Disc bulging and uncovertebral spurring asymmetric to the left  result in mild to moderate left neural foraminal stenosis at C6-7 without spinal stenosis. C7-T1: Negative. IMPRESSION: 1. No acute intracranial abnormality. 2. 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 No further imaging evaluation or follow-up is necessary. Consider endocrine function tests and correlate for history of pituitary hypersecretion. This follows ACR consensus guidelines: Management of Incidental Pituitary Findings on CT, MRI and F18-FDG PET: A White Paper of the ACR Incidental Findings Committee. J Am Coll Radiol 2018; 15: 966-72. 3. Mild cervical disc degeneration, greatest at C6-7 where there is mild-to-moderate left neural foraminal stenosis. No spinal stenosis. Electronically Signed   By: Logan Bores M.D.   On: 12/07/2018 12:25    Procedures Procedures (including critical care time)  Medications Ordered in ED Medications  sodium chloride flush (NS) 0.9 % injection 3 mL (has no administration in time range)     Initial Impression / Assessment and Plan / ED Course  I have reviewed the triage vital signs and the nursing notes.  Pertinent labs & imaging results that were available during my care of the patient were reviewed by me and considered in my medical decision making (see chart for details).     Brooke Austin is a 48 year old female with history of gastric cancer who presents to the ED with left arm weakness.  Patient with last known normal last night before sleep.  Took a sleeping pill last night and unsure if she fell asleep on this arm.  She woke up with left arm weakness mostly in the left lower arm at the wrist and elbow.  Has some decreased sensation along the radial/medial nerve in the left wrist, does not  have any tenderness in the arm. Sorta patchy sensation loss overall in LUE. Appears otherwise neurologically intact with no vision issues or neglect or aphasia.  Concern for possible peripheral nerve palsy versus stroke.  CT scan of  the head showed no acute findings.  Patient does not have any neck pain or any neck trauma.  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  Lab work overall unremarkable.  Hemoglobin at baseline.  Electrolytes at baseline.  Patient recently had intussusception and has had poor diet and some failure to thrive.  She has been followed closely by oncology for her nutrition as her albumin is severely low.  She may need TPN in the future.  At this time however will focus on left arm weakness.  We will get an MRI of the brain and cervical spine to rule out stroke versus metastasis versus stenosis.  However could be a peripheral nerve palsy as well.  MRI of the brain is unremarkable.  No stroke.  No metastasis.  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  No further imaging is necessary for this.  However may need to follow-up with primary care doctor for endocrine function test.  They were made aware of this finding.  Patient also with some C6-C7 mild to moderate left neural foraminal stenosis but no spinal stenosis.  This does not appear to be contributing fully to the patient's symptoms.  Talked with Dr. 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  Recommends a wrist splint, steroids.  We will have her follow-up with neurology for nerve conduction studies.  Discharged from the ED in good condition.  Given return precautions.  This chart was dictated using voice recognition software.  Despite best efforts to proofread,  errors can occur which can change the documentation meaning.    Final Clinical Impressions(s) / ED Diagnoses   Final diagnoses:  Wrist weakness  Nerve palsy    ED Discharge Orders         Ordered    methylPREDNISolone (MEDROL DOSEPAK) 4 MG TBPK tablet     12/07/18 1322           Verna Desrocher, DO 12/07/18 1323

## 2018-12-07 NOTE — Discharge Instructions (Signed)
Follow-up with your primary care doctor to discuss endocrine studies.  You most likely have a pituitary cyst however could discuss getting endocrine studies for further evaluation.  Follow-up with neurology for nerve palsy.  Wear the splint for comfort.  Return to the emergency department if symptoms worsen.

## 2018-12-07 NOTE — ED Notes (Signed)
Charge RN notified re: pt status, pt to be roomed after CT

## 2018-12-07 NOTE — ED Triage Notes (Signed)
Pt in reports L sided hand weakness onset last night @ 23:00, pt had gastric cancer in May 2019 with total gastrectomy a few weeks afterwards, pt reports recent sx for intussuseption, pt does not have slurred speech or facial droop at this time, pt A&O x4, pt received x 4  81 mg ASA

## 2018-12-08 ENCOUNTER — Encounter

## 2018-12-08 ENCOUNTER — Telehealth: Payer: Self-pay

## 2018-12-08 ENCOUNTER — Ambulatory Visit: Payer: 59 | Admitting: Gastroenterology

## 2018-12-08 NOTE — Telephone Encounter (Signed)
Nutrition  Called patient for nutrition follow-up.  No answer.  Left message with contact information.   Wyatt Galvan B. Zenia Resides, [place], [place] Registered Dietitian [phone] (pager)

## 2018-12-09 ENCOUNTER — Encounter: Payer: Self-pay | Admitting: General Practice

## 2018-12-09 NOTE — Progress Notes (Signed)
[facility] CSW Progress Notes  Call to patient to attempt to reschedule missed appointments for supportive counseling.  Left VM w my contact information and requested call back.  Also advised oncologist of status.  Edwyna Shell, LCSW Clinical Social Worker Phone:  [phone] Cell:  [phone]

## 2018-12-13 DIAGNOSIS — E8809 Other disorders of plasma-protein metabolism, not elsewhere classified: Secondary | ICD-10-CM | POA: Diagnosis not present

## 2018-12-13 DIAGNOSIS — R636 Underweight: Secondary | ICD-10-CM | POA: Diagnosis not present

## 2018-12-13 DIAGNOSIS — M7989 Other specified soft tissue disorders: Secondary | ICD-10-CM | POA: Diagnosis not present

## 2018-12-13 DIAGNOSIS — Z713 Dietary counseling and surveillance: Secondary | ICD-10-CM | POA: Diagnosis not present

## 2018-12-13 DIAGNOSIS — C163 Malignant neoplasm of pyloric antrum: Secondary | ICD-10-CM | POA: Diagnosis not present

## 2018-12-13 DIAGNOSIS — K649 Unspecified hemorrhoids: Secondary | ICD-10-CM | POA: Diagnosis not present

## 2018-12-13 DIAGNOSIS — Z681 Body mass index (BMI) 19 or less, adult: Secondary | ICD-10-CM | POA: Diagnosis not present

## 2018-12-13 MED FILL — HYDROCORTISONE AC 25 MG SUP: 25 | 6 days supply | Qty: 12 | Fill #0

## 2018-12-14 MED FILL — PREGABALIN 50 MG CAPS: 50 | 30 days supply | Qty: 60 | Fill #0

## 2018-12-14 MED FILL — oxyCODONE HCL 5 MG TABS: 5 | 5 days supply | Qty: 20 | Fill #0

## 2018-12-14 MED FILL — CELECOXIB 50 MG CAPS: 50 | 30 days supply | Qty: 30 | Fill #0

## 2018-12-16 DIAGNOSIS — R29898 Other symptoms and signs involving the musculoskeletal system: Secondary | ICD-10-CM | POA: Diagnosis not present

## 2018-12-16 DIAGNOSIS — Z79899 Other long term (current) drug therapy: Secondary | ICD-10-CM | POA: Diagnosis not present

## 2018-12-16 DIAGNOSIS — K912 Postsurgical malabsorption, not elsewhere classified: Secondary | ICD-10-CM | POA: Diagnosis not present

## 2018-12-16 DIAGNOSIS — Z681 Body mass index (BMI) 19 or less, adult: Secondary | ICD-10-CM | POA: Diagnosis not present

## 2018-12-16 DIAGNOSIS — M6258 Muscle wasting and atrophy, not elsewhere classified, other site: Secondary | ICD-10-CM | POA: Diagnosis not present

## 2018-12-16 DIAGNOSIS — E43 Unspecified severe protein-calorie malnutrition: Secondary | ICD-10-CM | POA: Diagnosis not present

## 2018-12-16 DIAGNOSIS — Z803 Family history of malignant neoplasm of breast: Secondary | ICD-10-CM | POA: Diagnosis not present

## 2018-12-16 DIAGNOSIS — R531 Weakness: Secondary | ICD-10-CM | POA: Diagnosis not present

## 2018-12-16 DIAGNOSIS — R208 Other disturbances of skin sensation: Secondary | ICD-10-CM | POA: Diagnosis not present

## 2018-12-16 DIAGNOSIS — Z808 Family history of malignant neoplasm of other organs or systems: Secondary | ICD-10-CM | POA: Diagnosis not present

## 2018-12-16 DIAGNOSIS — K222 Esophageal obstruction: Secondary | ICD-10-CM | POA: Diagnosis not present

## 2018-12-16 DIAGNOSIS — Z800 Family history of malignant neoplasm of digestive organs: Secondary | ICD-10-CM | POA: Diagnosis not present

## 2018-12-16 DIAGNOSIS — D509 Iron deficiency anemia, unspecified: Secondary | ICD-10-CM | POA: Diagnosis not present

## 2018-12-16 DIAGNOSIS — F419 Anxiety disorder, unspecified: Secondary | ICD-10-CM | POA: Diagnosis not present

## 2018-12-16 DIAGNOSIS — Z888 Allergy status to other drugs, medicaments and biological substances status: Secondary | ICD-10-CM | POA: Diagnosis not present

## 2018-12-16 DIAGNOSIS — Z87891 Personal history of nicotine dependence: Secondary | ICD-10-CM | POA: Diagnosis not present

## 2018-12-16 DIAGNOSIS — R609 Edema, unspecified: Secondary | ICD-10-CM | POA: Diagnosis not present

## 2018-12-16 DIAGNOSIS — R627 Adult failure to thrive: Secondary | ICD-10-CM | POA: Diagnosis not present

## 2018-12-17 DIAGNOSIS — E43 Unspecified severe protein-calorie malnutrition: Secondary | ICD-10-CM | POA: Insufficient documentation

## 2018-12-20 DIAGNOSIS — R627 Adult failure to thrive: Secondary | ICD-10-CM | POA: Diagnosis not present

## 2018-12-21 ENCOUNTER — Telehealth: Payer: Self-pay

## 2018-12-21 DIAGNOSIS — R627 Adult failure to thrive: Secondary | ICD-10-CM | POA: Diagnosis not present

## 2018-12-21 NOTE — Telephone Encounter (Signed)
Left VM to confirm telephone encounter and verify demographics

## 2018-12-22 ENCOUNTER — Encounter: Payer: 59 | Admitting: Obstetrics & Gynecology

## 2018-12-22 ENCOUNTER — Other Ambulatory Visit: Payer: Self-pay | Admitting: *Deleted

## 2018-12-22 ENCOUNTER — Inpatient Hospital Stay: Payer: 59

## 2018-12-22 MED ORDER — CHICKEN FLAVOR WATER MISCIBLE LIQD
100.00 | Status: DC
Start: 2018-12-21 — End: 2018-12-22

## 2018-12-22 MED ORDER — SODIUM CHLORIDE 0.45 % IV SOLN
INTRAVENOUS | Status: DC
Start: ? — End: 2018-12-22

## 2018-12-22 MED ORDER — DALTEPARIN SODIUM 95000 UNIT/9.5ML ~~LOC~~ SOLN
15.00 | SUBCUTANEOUS | Status: DC
Start: ? — End: 2018-12-22

## 2018-12-22 MED ORDER — VICON FORTE PO CAPS
17.00 | ORAL_CAPSULE | ORAL | Status: DC
Start: 2018-12-20 — End: 2018-12-22

## 2018-12-22 MED ORDER — ISOVUE-M 300 61 % IJ SOLN
4.00 | INTRAMUSCULAR | Status: DC
Start: ? — End: 2018-12-22

## 2018-12-22 MED ORDER — Medication
1.00 | Status: DC
Start: 2018-12-20 — End: 2018-12-22

## 2018-12-22 MED ORDER — NEXTERONE 150-4.21 MG/100ML-% IV SOLN
50.00 | INTRAVENOUS | Status: DC
Start: 2018-12-20 — End: 2018-12-22

## 2018-12-22 MED ORDER — INFANTS PANADOL PO
10.00 | ORAL | Status: DC
Start: 2018-12-20 — End: 2018-12-22

## 2018-12-22 MED ORDER — Medication
5.00 | Status: DC
Start: ? — End: 2018-12-22

## 2018-12-22 MED ORDER — CELLULOSE SODIUM PHOSPHATE VI
5000.00 | Status: DC
Start: 2018-12-20 — End: 2018-12-22

## 2018-12-22 MED ORDER — Medication
500.00 | Status: DC
Start: 2018-12-21 — End: 2018-12-22

## 2018-12-22 MED ORDER — PHENYLEPHRINE-MINERAL OIL-PET 0.25-14-74.9 % RE OINT
TOPICAL_OINTMENT | RECTAL | Status: DC
Start: ? — End: 2018-12-22

## 2018-12-22 NOTE — Patient Outreach (Signed)
[facility]) Care Management  12/22/2018  Ansa Derickson December 09, 1970 [id number]   Transition of care telephone call  Referral received: 12/21/18 Initial outreach: 12/22/18 Insurance: [employer]  Initial unsuccessful telephone call to patient's preferred (mobile) number in order to complete transition of care assessment; no answer, left HIPAA compliant voicemail message requesting return call.   Objective: Per the electronic medical record, Mrs. Ramyah Witcher was hospitalized at [facility] from 9/24-9/28 for severe protein malnutrition. A PICC (peripheral inserted central cathter) line was inserted and TPN (total parenteral nutrition) therapy was initiated as patient cannot tolerate jejunostomy (J tube) feedings.    Comorbidities include: gastric cancer s/p total gastrectomy with insertion of jejunostomy tube, Barrett's esophagus, anxiety, insomnia, iron deficiency anemia She was discharged to home on 12/20/18 with [facility] to provide home health nursing services for assistance with administration of TPN and PICC line care per the discharge summary.   Plan: This RNCM will route unsuccessful outreach letter with [place] Management pamphlet and 24 hour Nurse Advice Line Magnet to [place] [facility] clinical pool to be mailed to patient's home address. This RNCM will attempt another outreach within 4 business days.  Barrington Ellison RN,CCM,CDE [place] Management Coordinator Office Phone [phone] Office Fax [phone]

## 2018-12-22 NOTE — Progress Notes (Signed)
Nutrition  Patient being followed by Nutrition team at [facility].  Noted outpatient visit on 9/21 and admission with TPN started on 9/24.  Planned follow up with [facility] nutrition team scheduled for October 20th. Medical oncologist aware.    No further follow-up planned at this time as being followed by [facility].    Brooke Austin B. Zenia Resides, [place], [place] Registered Dietitian [phone] (pager)

## 2018-12-23 DIAGNOSIS — R627 Adult failure to thrive: Secondary | ICD-10-CM | POA: Diagnosis not present

## 2018-12-24 ENCOUNTER — Encounter: Payer: Self-pay | Admitting: *Deleted

## 2018-12-24 ENCOUNTER — Other Ambulatory Visit: Payer: Self-pay | Admitting: *Deleted

## 2018-12-24 ENCOUNTER — Other Ambulatory Visit: Payer: Self-pay | Admitting: Family Medicine

## 2018-12-24 DIAGNOSIS — G4700 Insomnia, unspecified: Secondary | ICD-10-CM

## 2018-12-24 NOTE — Patient Outreach (Signed)
[facility] Care Management  12/24/2018  Irelynn Clopton 12/23/1970 [id number]  Transition of care call/case closure   Referral received: 12/21/18 Initial outreach: 12/22/18 Insurance: [place] [employer]   Subjective: Successful second outreach attempt to patient's preferred (mobile) number in order to complete transition of care assessment; 2 HIPAA identifiers verified. Explained purpose of call and completed transition of care assessment.  Mrs. Grimaldo states she is doing well, states pain is well managed with prescribed medications, tolerating liquid diet, denies bowel or bladder problems.  Spouse, a [place] hospitalist, and her aunt who is also her godmother, are assisting with her recovery.  She says the home health nurse made a visit yesterday and will return on 12/27/18.  She says the nurse changed the dressing at her PICC (peripherally inserted central catheter) insertion site  She says she uses a [facility] outpatient pharmacy.  She denies educational needs related to staying safe during the COVID 19 pandemic.     Objective: Per the electronic medical record, Mrs. Tanyka Alders was hospitalized at [facility] from 9/24-9/28 for severe protein malnutrition. A PICC (peripheral inserted central cathter) was inserted and TPN (total parenteral nutrition) therapy was initiated as patient cannot tolerate jejunostomy (J tube) feedings.    Comorbidities include: gastric cancer s/p total gastrectomy with insertion of jejunostomy tube on 08/19/17, the jejunostomy tube was removed on 09/22/17, Barrett's esophagus, anxiety, insomnia, iron deficiency anemia She was discharged to home on 12/20/18 with [place] to provide home health nursing services for assistance with administration of TPN and PICC line care per the discharge summary.   Assessment:  Patient voices good understanding of all discharge instructions.  See transition of care flowsheet  for assessment details.   Plan:  Reviewed hospital discharge diagnosis of severe protein malnutrition with insertion of PICC (peripheral inserted central cathter) for TPN (total parenteral nutrition) administration  and discharge treatment plan using hospital discharge instructions, assessing medication adherence, reviewing problems requiring provider notification, and discussing the importance of follow up with surgeon, primary care provider and/or specialists as directed. No ongoing care management needs identified so will close case to [place] [facility] services. An unsuccessful outreach letter with [place] [employer] pamphlet and 24 Hour Nurse Line Magnet was routed to [place] Management clinical pool on 9/30 to be mailed to patient's home address.   Barrington Ellison RN,CCM,CDE [place] Management Coordinator Office Phone [phone] Office Fax [phone]

## 2018-12-27 ENCOUNTER — Other Ambulatory Visit: Payer: Self-pay | Admitting: Hematology

## 2018-12-27 DIAGNOSIS — G4700 Insomnia, unspecified: Secondary | ICD-10-CM

## 2018-12-27 NOTE — Telephone Encounter (Signed)
Left message for pt to call me back 

## 2018-12-27 NOTE — Telephone Encounter (Signed)
This should be refilled by her oncologist or whomever is overseeing her treatment plan at this time. She has been hospitalized since I last saw her and I am not sure we have her current medication list, nor am I up to date with her current treatment plan.

## 2018-12-27 NOTE — Telephone Encounter (Signed)
Is this okay to refill? 

## 2018-12-28 DIAGNOSIS — K561 Intussusception: Secondary | ICD-10-CM | POA: Diagnosis not present

## 2018-12-28 DIAGNOSIS — R627 Adult failure to thrive: Secondary | ICD-10-CM | POA: Diagnosis not present

## 2018-12-28 MED FILL — AZITHROMYCIN 250 MG TABLET: 250 | 5 days supply | Qty: 6 | Fill #0

## 2018-12-29 DIAGNOSIS — M6289 Other specified disorders of muscle: Secondary | ICD-10-CM | POA: Diagnosis not present

## 2018-12-29 DIAGNOSIS — R600 Localized edema: Secondary | ICD-10-CM | POA: Diagnosis not present

## 2018-12-29 DIAGNOSIS — G5632 Lesion of radial nerve, left upper limb: Secondary | ICD-10-CM | POA: Diagnosis not present

## 2018-12-29 DIAGNOSIS — R627 Adult failure to thrive: Secondary | ICD-10-CM | POA: Diagnosis not present

## 2018-12-30 DIAGNOSIS — R627 Adult failure to thrive: Secondary | ICD-10-CM | POA: Diagnosis not present

## 2018-12-31 MED FILL — TEMAZEPAM 15 MG CAPSULE: 15 | 30 days supply | Qty: 30 | Fill #0

## 2018-12-31 MED FILL — BACLOFEN 10 MG TABS: 10 | 20 days supply | Qty: 60 | Fill #0

## 2019-01-03 NOTE — Telephone Encounter (Signed)
I called pt to see if she still needs Temazepam refill which was requested by her pharmacy. She recently started TPN for her malnutrition, and overall is doing better. Pt informed me that her PCP Dr. Daryll Brod has been refilling all her meds, and she does not need refill from my office for now. She is aware her next appointment with [place] in Nov. She has no other concerns at this point.   Brooke Austin  01/03/2019

## 2019-01-04 DIAGNOSIS — R627 Adult failure to thrive: Secondary | ICD-10-CM | POA: Diagnosis not present

## 2019-01-04 DIAGNOSIS — K561 Intussusception: Secondary | ICD-10-CM | POA: Diagnosis not present

## 2019-01-05 DIAGNOSIS — M7989 Other specified soft tissue disorders: Secondary | ICD-10-CM | POA: Diagnosis not present

## 2019-01-05 DIAGNOSIS — G5632 Lesion of radial nerve, left upper limb: Secondary | ICD-10-CM | POA: Diagnosis not present

## 2019-01-05 MED FILL — CEPHALEXIN 500 MG CAPSULE: 500 | 7 days supply | Qty: 28 | Fill #0

## 2019-01-06 DIAGNOSIS — R627 Adult failure to thrive: Secondary | ICD-10-CM | POA: Diagnosis not present

## 2019-01-06 DIAGNOSIS — K561 Intussusception: Secondary | ICD-10-CM | POA: Diagnosis not present

## 2019-01-06 DIAGNOSIS — L905 Scar conditions and fibrosis of skin: Secondary | ICD-10-CM | POA: Diagnosis not present

## 2019-01-06 DIAGNOSIS — K529 Noninfective gastroenteritis and colitis, unspecified: Secondary | ICD-10-CM | POA: Diagnosis not present

## 2019-01-06 DIAGNOSIS — E43 Unspecified severe protein-calorie malnutrition: Secondary | ICD-10-CM | POA: Diagnosis not present

## 2019-01-06 DIAGNOSIS — C169 Malignant neoplasm of stomach, unspecified: Secondary | ICD-10-CM | POA: Diagnosis not present

## 2019-01-06 DIAGNOSIS — K5660 Unspecified intestinal obstruction: Secondary | ICD-10-CM | POA: Diagnosis not present

## 2019-01-06 DIAGNOSIS — R1033 Periumbilical pain: Secondary | ICD-10-CM | POA: Diagnosis not present

## 2019-01-10 DIAGNOSIS — M6289 Other specified disorders of muscle: Secondary | ICD-10-CM | POA: Diagnosis not present

## 2019-01-10 DIAGNOSIS — K561 Intussusception: Secondary | ICD-10-CM | POA: Diagnosis not present

## 2019-01-10 DIAGNOSIS — M6281 Muscle weakness (generalized): Secondary | ICD-10-CM | POA: Diagnosis not present

## 2019-01-10 DIAGNOSIS — R627 Adult failure to thrive: Secondary | ICD-10-CM | POA: Diagnosis not present

## 2019-01-11 MED FILL — PREGABALIN 50 MG CAPS: 50 | 30 days supply | Qty: 60 | Fill #0

## 2019-01-11 MED FILL — oxyCODONE HCL 5 MG TABS: 5 | 5 days supply | Qty: 20 | Fill #0

## 2019-01-11 MED FILL — CELECOXIB 50 MG CAPS: 50 | 30 days supply | Qty: 30 | Fill #0

## 2019-01-13 DIAGNOSIS — R627 Adult failure to thrive: Secondary | ICD-10-CM | POA: Diagnosis not present

## 2019-01-17 DIAGNOSIS — G5632 Lesion of radial nerve, left upper limb: Secondary | ICD-10-CM | POA: Diagnosis not present

## 2019-01-17 DIAGNOSIS — M79642 Pain in left hand: Secondary | ICD-10-CM | POA: Diagnosis not present

## 2019-01-17 DIAGNOSIS — Z789 Other specified health status: Secondary | ICD-10-CM | POA: Diagnosis not present

## 2019-01-18 DIAGNOSIS — R627 Adult failure to thrive: Secondary | ICD-10-CM | POA: Diagnosis not present

## 2019-01-18 DIAGNOSIS — K561 Intussusception: Secondary | ICD-10-CM | POA: Diagnosis not present

## 2019-01-20 DIAGNOSIS — K561 Intussusception: Secondary | ICD-10-CM | POA: Diagnosis not present

## 2019-01-20 DIAGNOSIS — L910 Hypertrophic scar: Secondary | ICD-10-CM | POA: Diagnosis not present

## 2019-01-20 DIAGNOSIS — M79642 Pain in left hand: Secondary | ICD-10-CM | POA: Diagnosis not present

## 2019-01-20 DIAGNOSIS — R627 Adult failure to thrive: Secondary | ICD-10-CM | POA: Diagnosis not present

## 2019-01-20 DIAGNOSIS — G5632 Lesion of radial nerve, left upper limb: Secondary | ICD-10-CM | POA: Diagnosis not present

## 2019-01-20 DIAGNOSIS — L089 Local infection of the skin and subcutaneous tissue, unspecified: Secondary | ICD-10-CM | POA: Diagnosis not present

## 2019-01-20 DIAGNOSIS — Z789 Other specified health status: Secondary | ICD-10-CM | POA: Diagnosis not present

## 2019-01-24 DIAGNOSIS — Z85028 Personal history of other malignant neoplasm of stomach: Secondary | ICD-10-CM | POA: Diagnosis not present

## 2019-01-24 DIAGNOSIS — Z08 Encounter for follow-up examination after completed treatment for malignant neoplasm: Secondary | ICD-10-CM | POA: Diagnosis not present

## 2019-01-25 DIAGNOSIS — G5632 Lesion of radial nerve, left upper limb: Secondary | ICD-10-CM | POA: Diagnosis not present

## 2019-01-25 DIAGNOSIS — M79642 Pain in left hand: Secondary | ICD-10-CM | POA: Diagnosis not present

## 2019-01-25 DIAGNOSIS — Z789 Other specified health status: Secondary | ICD-10-CM | POA: Diagnosis not present

## 2019-01-25 DIAGNOSIS — R627 Adult failure to thrive: Secondary | ICD-10-CM | POA: Diagnosis not present

## 2019-01-25 DIAGNOSIS — K561 Intussusception: Secondary | ICD-10-CM | POA: Diagnosis not present

## 2019-01-26 DIAGNOSIS — M549 Dorsalgia, unspecified: Secondary | ICD-10-CM | POA: Diagnosis not present

## 2019-01-26 DIAGNOSIS — M25532 Pain in left wrist: Secondary | ICD-10-CM | POA: Diagnosis not present

## 2019-01-26 DIAGNOSIS — F419 Anxiety disorder, unspecified: Secondary | ICD-10-CM | POA: Diagnosis not present

## 2019-01-26 DIAGNOSIS — G4700 Insomnia, unspecified: Secondary | ICD-10-CM | POA: Diagnosis not present

## 2019-01-26 DIAGNOSIS — E538 Deficiency of other specified B group vitamins: Secondary | ICD-10-CM | POA: Diagnosis not present

## 2019-01-26 MED FILL — PARoxetine HCL 20 MG TABS: 20 | 30 days supply | Qty: 30 | Fill #0

## 2019-01-27 DIAGNOSIS — R627 Adult failure to thrive: Secondary | ICD-10-CM | POA: Diagnosis not present

## 2019-01-27 MED FILL — BACLOFEN 10 MG TABS: 10 | 20 days supply | Qty: 60 | Fill #0

## 2019-01-27 MED FILL — TEMAZEPAM 15 MG CAPSULE: 15 | 30 days supply | Qty: 30 | Fill #0

## 2019-01-30 DIAGNOSIS — R627 Adult failure to thrive: Secondary | ICD-10-CM | POA: Diagnosis not present

## 2019-01-31 DIAGNOSIS — M6281 Muscle weakness (generalized): Secondary | ICD-10-CM | POA: Diagnosis not present

## 2019-01-31 DIAGNOSIS — M6289 Other specified disorders of muscle: Secondary | ICD-10-CM | POA: Diagnosis not present

## 2019-02-01 DIAGNOSIS — R627 Adult failure to thrive: Secondary | ICD-10-CM | POA: Diagnosis not present

## 2019-02-01 DIAGNOSIS — M79642 Pain in left hand: Secondary | ICD-10-CM | POA: Diagnosis not present

## 2019-02-01 DIAGNOSIS — G5632 Lesion of radial nerve, left upper limb: Secondary | ICD-10-CM | POA: Diagnosis not present

## 2019-02-01 DIAGNOSIS — Z789 Other specified health status: Secondary | ICD-10-CM | POA: Diagnosis not present

## 2019-02-01 DIAGNOSIS — K561 Intussusception: Secondary | ICD-10-CM | POA: Diagnosis not present

## 2019-02-02 DIAGNOSIS — R627 Adult failure to thrive: Secondary | ICD-10-CM | POA: Diagnosis not present

## 2019-02-02 NOTE — Progress Notes (Signed)
Brooke Austin   [phone] Fax:(336) [phone]   Clinic Follow up Note   Patient Care Team: Girtha Rm, NP-C as PCP - General (Family Medicine)  Date of Service:  02/04/2019  CHIEF COMPLAINT: F/u h/o gastric cancer  SUMMARY OF ONCOLOGIC HISTORY: Oncology History Overview Note  Cancer Staging Malignant neoplasm of stomach ([place]) Staging form: Stomach, AJCC 8th Edition - Pathologic stage from 10/04/2018: Stage IA (pT1a, pN0, cM0) - Signed by Truitt Merle, MD on 10/04/2018    Malignant neoplasm of stomach ([place])  07/23/2017 Initial Biopsy   07/23/17 EGD revealed malignant gastric ulcer. 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   07/27/2017 Initial Diagnosis   Malignant neoplasm of stomach ([place])   07/28/2017 Imaging   CT CAP W Contrast 07/28/17 [facility] IMPRESSION: 1. The primary gastric lesion is not readily apparent on this study. 2. Borderline enlarged gastrohepatic ligament node measures 1 cm. Additionally there is a ovoid soft tissue attenuating nodule within the left periaortic retroperitoneum which is indeterminate. Periaortic adenopathy cannot be excluded. Consider further evaluation with PET-CT. 3. Tiny nonspecific nodule in the left upper lobe measures 4 mm.   07/30/2017 PET scan   PET 07/30/17 [facility] IMPRESSION: 1. No hypermetabolic mass or adenopathy to suggest metastatic disease. 2. Low level FDG uptake associated with the periaortic left retroperitoneal nodule compatible with a benign etiology. 3. 4 mm left upper lobe lung nodule is too small to characterize by PET-CT.   08/19/2017 Surgery   Total Gastrectomy 08/19/2017 at [facility]   08/19/2017 Pathology Results   FINAL PATHOLOGIC DIAGNOSIS from [facility] MICROSCOPIC EXAMINATION AND DIAGNOSIS  A.  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 The tumor invades into the lamina propria. Angiolymphatic invasion is not identified. Perineural invasion is not identified.       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       Glandular mucosa with intestinal metaplasia in the distal esophagus consistent with specialized Barrett's mucosa; negative for dysplasia.       Multiple (49) perigastric lymph nodes are negative for tumor (0/49).       Squamous esophageal mucosa at the proximal surgical margin; negative for tumor.       Duodenal mucosa at the distal surgical margin; negative for tumor,       All surgical margins are negative for tumor (minimum tumor free margin, 7.0 cm, distal surgical margin).  B.  LYMPH NODES, HEPATIC ARTERY, EXCISION:      Multiple (8) hepatic artery lymph nodes are negative for tumor (0/8).  C.  LYMPH NODES, SPLENIC ARTERY, EXCISION:      Multiple (2) splenic artery lymph nodes are negative for    12/07/2017 Imaging   CT CAP W Constrast 12/07/17 [facility] IMPRESSION: 1. Status post gastrectomy, without residual or recurrent disease. 2. Left periaortic adenopathy, felt to be similar to the prior exam. Not FDG avid on interval PE T. Favored to be reactive. Recommend attention on follow-up. 3.  Aortic Atherosclerosis (ICD10-I70.0).  This is age advanced. 4. Tiny bilateral pulmonary nodules are unchanged, favoring a benign etiology.   12/10/2017 Mammogram   Diagnostic Mammogram 12/10/17 [facility] FINDINGS:  Mammographically, there is a group of punctate calcifications in the retroareolar right breast anterior third. Oval mass with circumscribed margins in the outer central aspect of the left  breast middle third. On physical examination, there is no palpable abnormality in the left breast at the area of mammographic concern. On targeted ultrasound, there is no sonographic correlate for the mammographic finding. Incidental intramammary lymph node  measuring 8 x 6 x 2 mm at the 3:00 position of the left breast 4 cm from the nipple.   02/26/2018 Imaging   CT AP W contrast 02/26/18 [facility] IMPRESSION: 1. Interval improvement or near complete resolution of the previously seen colitis involving the transverse colon. 2. Mildly thickened segment of small bowel in the left hemipelvis concerning for enteritis. Clinical correlation is recommended. No bowel obstruction. Normal appendix. 3. Large amount of stool throughout the colon. 4. A 3.5 cm left ovarian complex/hemorrhagic cyst. Ultrasound may provide better evaluation of the pelvic structures. Probable right ovarian corpus luteum.    03/02/2018 Procedure   On 03/02/18 EGD performed for dysphagia, esophageal discomfort and abdominal pain and showed mild stricture at EG junction, dilation was performed. Otherwise it was a normal EGD.  FINAL PATHOLOGIC DIAGNOSIS ESOPHAGUS, ENDOSCOPIC BIOPSY:      MILD CHRONIC INFLAMMATION.      NO EVIDENCE OF EOSINOPHILIC ESOPHAGITIS.   03/03/2018 Imaging   MRI AP 03/03/18  [facility]  IMPRESSION: Previous gastrectomy. No evidence of recurrent or metastatic disease within the abdomen or pelvis.  Interval resolution of left ovarian hemorrhagic cyst.   06/07/2018 Imaging   06/07/2018 CT Chest Abdomen and Pelvis [facility] IMPRESSION: 1. No evidence of local tumor recurrence status post distal gastrectomy. 2. No findings suspicious for metastatic disease in the chest, abdomen or pelvis. 3. Previously questioned left para-aortic adenopathy represents a benign venous variant in this patient with a circumaortic left renal vein. 4. Previously described tiny left pulmonary nodules are stable since 07/28/2017 CT and are probably benign. 5. Aortic Atherosclerosis (ICD10-I70.0).   06/11/2018 Mammogram   Diagnostic Mammogram 06/11/18  FINDINGS:  Unchanged group of punctate calcifications in the retroareolar right breast, anterior third. Unchanged oval equal density  mass with circumscribed margins in the outer central aspect of the left breast, middle third. No new suspicious mass, distortion or calcifications in either breast.   10/04/2018 Cancer Staging   Staging form: Stomach, AJCC 8th Edition - Pathologic stage from 10/04/2018: Stage IA (pT1a, pN0, cM0) - Signed by Truitt Merle, MD on 10/04/2018      CURRENT THERAPY:  Surveillance  INTERVAL HISTORY:  Brooke Austin is here for a follow up.  She presents to the clinic by herself. She unfortunately developed small bowel intussusception at jejunojejunal anastomosis on 11/11/2018 and required urgent surgical reduction at [facility].  Postop she had worsening malnutrition, and has required TPN in the past few months.  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 She complains of severe abdominal pain at her midline surgical incision, which has healed.  No nausea, vomiting, or other abdominal symptoms.  Her bowel movement has been normal, she has gained weight lately.  Review of system otherwise negative.  MEDICAL HISTORY:  Past Medical History:  Diagnosis Date  . Cancer ([place]) 2019   Gastric Stage 1A  . Colitis     SURGICAL HISTORY: Past Surgical History:  Procedure Laterality Date  . breast surgery    . GASTRECTOMY  2019    I have reviewed the social history and family history with the patient and they are unchanged from previous note.  ALLERGIES:  is allergic to melatonin and zolpidem.  MEDICATIONS:  Current Outpatient  Medications  Medication Sig Dispense Refill  . alteplase (CATHFLO ACTIVASE) 2 MG injection 2 mg by Intracatheter route.    . baclofen (LIORESAL) 10 MG tablet Take 10 mg by mouth 3 (three) times daily.     . bisacodyl (DULCOLAX) 10 MG suppository Place 10 mg rectally daily as needed for mild constipation or moderate constipation.     . celecoxib (CELEBREX) 50 MG capsule Take 50 mg by mouth daily.    . cyanocobalamin (,VITAMIN B-12,)  1000 MCG/ML injection Inject 1 mL (1,000 mcg total) into the muscle every 30 (thirty) days. 1 mL 5  . ondansetron (ZOFRAN-ODT) 4 MG disintegrating tablet Take 4 mg by mouth every 8 (eight) hours as needed for nausea or vomiting.     . Oxycodone HCl 10 MG TABS Take 10 mg by mouth as needed (pain).     Marland Kitchen PARoxetine (PAXIL) 20 MG tablet Take 20 mg by mouth daily.    . polyethylene glycol (MIRALAX / GLYCOLAX) 17 g packet Take 17 g by mouth 2 (two) times a day.    . pregabalin (LYRICA) 50 MG capsule Take 50 mg by mouth 3 (three) times daily.    Marland Kitchen senna-docusate (SENOKOT-S) 8.6-50 MG tablet Take 1 tablet by mouth at bedtime as needed for mild constipation.     . Syringe/Needle, Disp, (SYRINGE 3CC/23GX1") 23G X 1" 3 ML MISC 1 Syringe by Does not apply route every 30 (thirty) days. 1 each 5  . temazepam (RESTORIL) 15 MG capsule Take 1 capsule (15 mg total) by mouth at bedtime. 30 capsule 0  . traMADol (ULTRAM) 50 MG tablet Take 1 tablet (50 mg total) by mouth 3 (three) times daily as needed. 30 tablet 0  . baclofen (LIORESAL) 10 MG tablet TAKE 1/2 TO 1 TABLET (5-10 MG TOTAL) BY MOUTH 3 (THREE) TIMES DAILY AS NEEDED FOR MUSCLE SPASMS. 30 tablet 1  . hydrocortisone (ANUSOL-HC) 25 MG suppository Place 25 mg rectally daily as needed.     No current facility-administered medications for this visit.     PHYSICAL EXAMINATION: ECOG PERFORMANCE STATUS: 1 - Symptomatic but completely ambulatory  Vitals:   02/04/19 1412  BP: 104/70  Pulse: 70  Resp: 17  Temp: 98.5 F (36.9 C)  SpO2: 100%   Filed Weights   02/04/19 1412  Weight: 115 lb 4.8 oz (52.3 kg)    GENERAL:alert, no distress and comfortable SKIN: skin color, texture, turgor are normal, no rashes or significant lesions EYES: normal, Conjunctiva are pink and non-injected, sclera clear NECK: supple, thyroid normal size, non-tender, without nodularity LYMPH:  no palpable lymphadenopathy in the cervical, axillary  LUNGS: clear to auscultation and  percussion with normal breathing effort HEART: regular rate & rhythm and no murmurs and no lower extremity edema ABDOMEN:abdomen soft, midline surgical scar, incision has healed very well, moderate tenderness at the scar tissue from incision.  No organomegaly and normal bowel sounds Musculoskeletal:no cyanosis of digits and no clubbing  NEURO: alert & oriented x 3 with fluent speech, no focal motor/sensory deficits  LABORATORY DATA:  I have reviewed the data as listed CBC Latest Ref Rng & Units 02/04/2019 12/07/2018 12/07/2018  WBC 4.0 - 10.5 K/uL 6.4 - 8.6  Hemoglobin 12.0 - 15.0 g/dL 10.3(L) 9.5(L) 9.9(L)  Hematocrit 36.0 - 46.0 % 32.7(L) 28.0(L) 30.2(L)  Platelets 150 - 400 K/uL 516(H) - 805(H)     CMP Latest Ref Rng & Units 02/04/2019 12/07/2018 12/07/2018  Glucose 70 - 99 mg/dL 72 96 99  BUN  6 - 20 mg/dL 19 13 13   Creatinine 0.44 - 1.00 mg/dL 0.69 0.60 0.71  Sodium 135 - 145 mmol/L 142 135 135  Potassium 3.5 - 5.1 mmol/L 3.9 4.2 4.2  Chloride 98 - 111 mmol/L 109 100 101  CO2 22 - 32 mmol/L 25 - 26  Calcium 8.9 - 10.3 mg/dL 8.2(L) - 7.7(L)  Total Protein 6.5 - 8.1 g/dL 6.5 - 4.4(L)  Total Bilirubin 0.3 - 1.2 mg/dL <0.2(L) - 0.2(L)  Alkaline Phos 38 - 126 U/L 94 - 83  AST 15 - 41 U/L 17 - 29  ALT 0 - 44 U/L 19 - 23      RADIOGRAPHIC STUDIES: I have personally reviewed the radiological images as listed and agreed with the findings in the report. No results found.   ASSESSMENT & PLAN:  Brooke Austin is a 48 y.o. female with   1. Gastric Cancer, Stage I, pT1aN0M0 -Diagnosed in 07/2017. S/p total gastrectomy due to her strong family history of gastric cancer.  -I discussed her cancer was very early stage and, s/p complete resection and her risk for recurrence is low.  -She is currently on surveillance.  -She notes she had Colonoscopy/endoscopy in 08/2018. She does not have report but notes a colon polyp was removed. She will repeat in 3 years. She has been referred to GI,  waiting for appointment  -She had a CT abdomen pelvis in August 2020 for acute abdominal pain from small bowel intussusception at jejunojejunal anastomosis, scan showed no evidence of cancer recurrence  -She has been struggling with malnutrition postop, abdominal pain from incision, and left arm nerve damage. She is on TPN.  -she will continue follow-up with her surgeon Dr. Crisoforo Oxford at [facility] -I we will see her back in 9 months with lab, I do not plan to repeat surveillance CT scans due to her early stage disease.   2. Maulnutrion, Nausea, vomiting, Constipation, Acid Reflux -Since gastrectomy her GI symptoms have been greatly effecting her quality of life  -I previously Prilosec OTC, mirtazapine and continue Miralax TID.  -she is on TPN for malnutrition now, she has gained some weight.  3. Chronic sciatica low back pain, Joint pain of b/l knees, left arm weakness  -Chronic back pain since she was younger and tried to control it with yoga and exercise in the past -She sees orthopedic surgeon, Dr. Junius Roads -She has been treated with steroids and may start epidural injections for pain.  -On Tramadol 50mg  q6hours, I refilled for her today   4. Insomnia, Depression/Anxiety -Has been on Paxil. I previously called in Mirtazapine to ween her off Paxil -Since surgery her anxiety has worsened given her symptoms. She denies suicidal idea  -I previously referred her to our Education officer, museum for counseiling. She can see her PCP to help her manage her depression and medication as well.   5. B12 deficiency -Secondary to poor absorption from Gastrectomy  -She has tingling of finger, toes and ears. She has been prone to falling when standing.  -Continue omnthly B12 injections at home.  6. Anemia and thrombocytosis  -she has developed anemia and thrombocytosis, likely secondary to her surgery, B12 deficiency -I will also check her iron -Her thrombocytosis is likely reactive  7. Right breast calcifications and  left breast mass -as seen on her 12/10/17 Mammogram and stable on 06/11/18 mammogram -She is overdue for mammo, I encourage her to do it at [facility] -Patient has personal and family hx of gastric  cancer (mother, MGM, [place]) -Genetic testing from [facility] was negative    PLAN:  -lab reviewed. -I refilled her tramadol  -continue gastric cancer surveillance -She will follow-up with her surgeon Dr. Crisoforo Oxford at [facility] -lab and f/u in 9 months   No problem-specific Assessment & Plan notes found for this encounter.   No orders of the defined types were placed in this encounter.  All questions were answered. The patient knows to call the clinic with any problems, questions or concerns. No barriers to learning was detected. I spent 20 minutes counseling the patient face to face. The total time spent in the appointment was 25 minutes and more than 50% was on counseling and review of test results     Truitt Merle, MD 02/04/2019   I, Joslyn Devon, am acting as scribe for Truitt Merle, MD.   I have reviewed the above documentation for accuracy and completeness, and I agree with the above.

## 2019-02-03 ENCOUNTER — Other Ambulatory Visit: Payer: Self-pay

## 2019-02-03 DIAGNOSIS — C161 Malignant neoplasm of fundus of stomach: Secondary | ICD-10-CM

## 2019-02-03 DIAGNOSIS — G5632 Lesion of radial nerve, left upper limb: Secondary | ICD-10-CM | POA: Diagnosis not present

## 2019-02-03 DIAGNOSIS — M79642 Pain in left hand: Secondary | ICD-10-CM | POA: Diagnosis not present

## 2019-02-03 DIAGNOSIS — Z789 Other specified health status: Secondary | ICD-10-CM | POA: Diagnosis not present

## 2019-02-03 DIAGNOSIS — R627 Adult failure to thrive: Secondary | ICD-10-CM | POA: Diagnosis not present

## 2019-02-04 ENCOUNTER — Inpatient Hospital Stay (HOSPITAL_BASED_OUTPATIENT_CLINIC_OR_DEPARTMENT_OTHER): Payer: 59 | Admitting: Hematology

## 2019-02-04 ENCOUNTER — Other Ambulatory Visit: Payer: Self-pay

## 2019-02-04 ENCOUNTER — Inpatient Hospital Stay: Payer: 59 | Attending: Hematology

## 2019-02-04 ENCOUNTER — Encounter: Payer: Self-pay | Admitting: Hematology

## 2019-02-04 VITALS — BP 104/70 | HR 70 | Temp 98.5°F | Resp 17 | Ht 68.0 in | Wt 115.3 lb

## 2019-02-04 DIAGNOSIS — F329 Major depressive disorder, single episode, unspecified: Secondary | ICD-10-CM | POA: Insufficient documentation

## 2019-02-04 DIAGNOSIS — E538 Deficiency of other specified B group vitamins: Secondary | ICD-10-CM | POA: Insufficient documentation

## 2019-02-04 DIAGNOSIS — D473 Essential (hemorrhagic) thrombocythemia: Secondary | ICD-10-CM | POA: Insufficient documentation

## 2019-02-04 DIAGNOSIS — M5440 Lumbago with sciatica, unspecified side: Secondary | ICD-10-CM | POA: Insufficient documentation

## 2019-02-04 DIAGNOSIS — G8929 Other chronic pain: Secondary | ICD-10-CM | POA: Diagnosis not present

## 2019-02-04 DIAGNOSIS — Z800 Family history of malignant neoplasm of digestive organs: Secondary | ICD-10-CM | POA: Insufficient documentation

## 2019-02-04 DIAGNOSIS — N6320 Unspecified lump in the left breast, unspecified quadrant: Secondary | ICD-10-CM | POA: Diagnosis not present

## 2019-02-04 DIAGNOSIS — Z931 Gastrostomy status: Secondary | ICD-10-CM | POA: Diagnosis not present

## 2019-02-04 DIAGNOSIS — D649 Anemia, unspecified: Secondary | ICD-10-CM | POA: Insufficient documentation

## 2019-02-04 DIAGNOSIS — R109 Unspecified abdominal pain: Secondary | ICD-10-CM | POA: Insufficient documentation

## 2019-02-04 DIAGNOSIS — E46 Unspecified protein-calorie malnutrition: Secondary | ICD-10-CM | POA: Insufficient documentation

## 2019-02-04 DIAGNOSIS — C169 Malignant neoplasm of stomach, unspecified: Secondary | ICD-10-CM | POA: Diagnosis not present

## 2019-02-04 DIAGNOSIS — Z903 Acquired absence of stomach [part of]: Secondary | ICD-10-CM | POA: Insufficient documentation

## 2019-02-04 DIAGNOSIS — C161 Malignant neoplasm of fundus of stomach: Secondary | ICD-10-CM

## 2019-02-04 LAB — CMP (CANCER CENTER ONLY)
ALT: 19 U/L (ref 0–44)
AST: 17 U/L (ref 15–41)
Albumin: 3.2 g/dL — ABNORMAL LOW (ref 3.5–5.0)
Alkaline Phosphatase: 94 U/L (ref 38–126)
Anion gap: 8 (ref 5–15)
BUN: 19 mg/dL (ref 6–20)
CO2: 25 mmol/L (ref 22–32)
Calcium: 8.2 mg/dL — ABNORMAL LOW (ref 8.9–10.3)
Chloride: 109 mmol/L (ref 98–111)
Creatinine: 0.69 mg/dL (ref 0.44–1.00)
GFR, Est AFR Am: >60 mL/min (ref >60)
GFR, Estimated: >60 mL/min (ref >60)
Glucose, Bld: 72 mg/dL (ref 70–99)
Potassium: 3.9 mmol/L (ref 3.5–5.1)
Sodium: 142 mmol/L (ref 135–145)
Total Bilirubin: <0.2 mg/dL — ABNORMAL LOW (ref 0.3–1.2)
Total Protein: 6.5 g/dL (ref 6.5–8.1)

## 2019-02-04 LAB — CBC WITH DIFFERENTIAL (CANCER CENTER ONLY)
Abs Immature Granulocytes: 0.01 10*3/uL (ref 0.00–0.07)
Basophils Absolute: 0 10*3/uL (ref 0.0–0.1)
Basophils Relative: 1 %
Eosinophils Absolute: 0.3 10*3/uL (ref 0.0–0.5)
Eosinophils Relative: 4 %
HCT: 32.7 % — ABNORMAL LOW (ref 36.0–46.0)
Hemoglobin: 10.3 g/dL — ABNORMAL LOW (ref 12.0–15.0)
Immature Granulocytes: 0 %
Lymphocytes Relative: 26 %
Lymphs Abs: 1.7 10*3/uL (ref 0.7–4.0)
MCH: 29.4 pg (ref 26.0–34.0)
MCHC: 31.5 g/dL (ref 30.0–36.0)
MCV: 93.4 fL (ref 80.0–100.0)
Monocytes Absolute: 0.3 10*3/uL (ref 0.1–1.0)
Monocytes Relative: 5 %
Neutro Abs: 4.1 10*3/uL (ref 1.7–7.7)
Neutrophils Relative %: 64 %
Platelet Count: 516 10*3/uL — ABNORMAL HIGH (ref 150–400)
RBC: 3.5 MIL/uL — ABNORMAL LOW (ref 3.87–5.11)
RDW: 13.2 % (ref 11.5–15.5)
WBC Count: 6.4 10*3/uL (ref 4.0–10.5)
nRBC: 0 % (ref 0.0–0.2)

## 2019-02-04 LAB — VITAMIN D 25 HYDROXY (VIT D DEFICIENCY, FRACTURES): Vit D, 25-Hydroxy: 36.12 ng/mL (ref 30–100)

## 2019-02-04 LAB — CEA (IN HOUSE-CHCC): CEA (CHCC-In House): 3.39 ng/mL (ref 0.00–5.00)

## 2019-02-04 MED ORDER — TRAMADOL HCL 50 MG PO TABS: 50.0000 mg | ORAL_TABLET | Freq: Three times a day (TID) | ORAL | 0 refills | Status: DC | PRN

## 2019-02-04 MED FILL — traMADol HCL 50 MG TABS: 50 | 10 days supply | Qty: 30 | Fill #0

## 2019-02-07 ENCOUNTER — Telehealth: Payer: Self-pay | Admitting: Hematology

## 2019-02-07 ENCOUNTER — Encounter: Payer: Self-pay | Admitting: Internal Medicine

## 2019-02-07 DIAGNOSIS — M6289 Other specified disorders of muscle: Secondary | ICD-10-CM | POA: Diagnosis not present

## 2019-02-07 DIAGNOSIS — C169 Malignant neoplasm of stomach, unspecified: Secondary | ICD-10-CM | POA: Diagnosis not present

## 2019-02-07 DIAGNOSIS — F329 Major depressive disorder, single episode, unspecified: Secondary | ICD-10-CM | POA: Diagnosis not present

## 2019-02-07 DIAGNOSIS — N6320 Unspecified lump in the left breast, unspecified quadrant: Secondary | ICD-10-CM | POA: Diagnosis not present

## 2019-02-07 DIAGNOSIS — G8929 Other chronic pain: Secondary | ICD-10-CM | POA: Diagnosis not present

## 2019-02-07 DIAGNOSIS — R627 Adult failure to thrive: Secondary | ICD-10-CM | POA: Diagnosis not present

## 2019-02-07 DIAGNOSIS — D473 Essential (hemorrhagic) thrombocythemia: Secondary | ICD-10-CM | POA: Diagnosis not present

## 2019-02-07 DIAGNOSIS — E538 Deficiency of other specified B group vitamins: Secondary | ICD-10-CM | POA: Diagnosis not present

## 2019-02-07 DIAGNOSIS — D649 Anemia, unspecified: Secondary | ICD-10-CM | POA: Diagnosis not present

## 2019-02-07 DIAGNOSIS — M6281 Muscle weakness (generalized): Secondary | ICD-10-CM | POA: Diagnosis not present

## 2019-02-07 DIAGNOSIS — R109 Unspecified abdominal pain: Secondary | ICD-10-CM | POA: Diagnosis not present

## 2019-02-07 DIAGNOSIS — M5440 Lumbago with sciatica, unspecified side: Secondary | ICD-10-CM | POA: Diagnosis not present

## 2019-02-07 NOTE — Telephone Encounter (Signed)
Scheduled appt per 11/13 los. ° °Left a vm of the appt date and time °

## 2019-02-08 DIAGNOSIS — Z8719 Personal history of other diseases of the digestive system: Secondary | ICD-10-CM | POA: Diagnosis not present

## 2019-02-08 DIAGNOSIS — Z48815 Encounter for surgical aftercare following surgery on the digestive system: Secondary | ICD-10-CM | POA: Diagnosis not present

## 2019-02-08 DIAGNOSIS — Z87891 Personal history of nicotine dependence: Secondary | ICD-10-CM | POA: Diagnosis not present

## 2019-02-08 DIAGNOSIS — R627 Adult failure to thrive: Secondary | ICD-10-CM | POA: Diagnosis not present

## 2019-02-08 DIAGNOSIS — Z85028 Personal history of other malignant neoplasm of stomach: Secondary | ICD-10-CM | POA: Diagnosis not present

## 2019-02-08 DIAGNOSIS — Z903 Acquired absence of stomach [part of]: Secondary | ICD-10-CM | POA: Diagnosis not present

## 2019-02-08 LAB — IRON AND TIBC
Iron: 32 ug/dL — ABNORMAL LOW (ref 41–142)
Saturation Ratios: 8 % — ABNORMAL LOW (ref 21–57)
TIBC: 396 ug/dL (ref 236–444)
UIBC: 364 ug/dL (ref 120–384)

## 2019-02-08 LAB — FERRITIN: Ferritin: 6 ng/mL — ABNORMAL LOW (ref 11–307)

## 2019-02-08 MED FILL — oxyCODONE HCL 5 MG TABS: 5 | 4 days supply | Qty: 30 | Fill #0

## 2019-02-10 DIAGNOSIS — R627 Adult failure to thrive: Secondary | ICD-10-CM | POA: Diagnosis not present

## 2019-02-10 LAB — METHYLMALONIC ACID, SERUM: Methylmalonic Acid, Quantitative: 122 nmol/L (ref 0–378)

## 2019-02-11 ENCOUNTER — Other Ambulatory Visit: Payer: Self-pay | Admitting: Hematology

## 2019-02-11 DIAGNOSIS — M6281 Muscle weakness (generalized): Secondary | ICD-10-CM | POA: Diagnosis not present

## 2019-02-11 DIAGNOSIS — M6289 Other specified disorders of muscle: Secondary | ICD-10-CM | POA: Diagnosis not present

## 2019-02-11 DIAGNOSIS — M79642 Pain in left hand: Secondary | ICD-10-CM | POA: Diagnosis not present

## 2019-02-11 DIAGNOSIS — Z789 Other specified health status: Secondary | ICD-10-CM | POA: Diagnosis not present

## 2019-02-11 DIAGNOSIS — G5632 Lesion of radial nerve, left upper limb: Secondary | ICD-10-CM | POA: Diagnosis not present

## 2019-02-15 DIAGNOSIS — M79642 Pain in left hand: Secondary | ICD-10-CM | POA: Diagnosis not present

## 2019-02-15 DIAGNOSIS — R627 Adult failure to thrive: Secondary | ICD-10-CM | POA: Diagnosis not present

## 2019-02-15 DIAGNOSIS — G5632 Lesion of radial nerve, left upper limb: Secondary | ICD-10-CM | POA: Diagnosis not present

## 2019-02-15 DIAGNOSIS — K561 Intussusception: Secondary | ICD-10-CM | POA: Diagnosis not present

## 2019-02-15 DIAGNOSIS — Z789 Other specified health status: Secondary | ICD-10-CM | POA: Diagnosis not present

## 2019-02-16 DIAGNOSIS — M542 Cervicalgia: Secondary | ICD-10-CM | POA: Diagnosis not present

## 2019-02-16 DIAGNOSIS — M48061 Spinal stenosis, lumbar region without neurogenic claudication: Secondary | ICD-10-CM | POA: Diagnosis not present

## 2019-02-16 DIAGNOSIS — G5632 Lesion of radial nerve, left upper limb: Secondary | ICD-10-CM | POA: Diagnosis not present

## 2019-02-17 DIAGNOSIS — R627 Adult failure to thrive: Secondary | ICD-10-CM | POA: Diagnosis not present

## 2019-02-18 DIAGNOSIS — G5632 Lesion of radial nerve, left upper limb: Secondary | ICD-10-CM | POA: Diagnosis not present

## 2019-02-18 DIAGNOSIS — M79642 Pain in left hand: Secondary | ICD-10-CM | POA: Diagnosis not present

## 2019-02-18 DIAGNOSIS — Z789 Other specified health status: Secondary | ICD-10-CM | POA: Diagnosis not present

## 2019-02-18 MED FILL — PREGABALIN 50 MG CAPS: 50 | 30 days supply | Qty: 60 | Fill #0

## 2019-02-18 MED FILL — CELECOXIB 50 MG CAPS: 50 | 30 days supply | Qty: 30 | Fill #0

## 2019-02-22 DIAGNOSIS — R627 Adult failure to thrive: Secondary | ICD-10-CM | POA: Diagnosis not present

## 2019-02-22 DIAGNOSIS — K561 Intussusception: Secondary | ICD-10-CM | POA: Diagnosis not present

## 2019-02-23 ENCOUNTER — Telehealth: Payer: Self-pay

## 2019-02-23 DIAGNOSIS — R627 Adult failure to thrive: Secondary | ICD-10-CM | POA: Diagnosis not present

## 2019-02-23 DIAGNOSIS — C160 Malignant neoplasm of cardia: Secondary | ICD-10-CM | POA: Diagnosis not present

## 2019-02-23 NOTE — Telephone Encounter (Signed)
Patient called inquiring about the recommended IV iron infusion per Dr. Burr Medico.  Explained to her that [facility] has not approved her infusion but hopefully we should hear within the next day or so and get her scheduled.

## 2019-02-24 ENCOUNTER — Other Ambulatory Visit: Payer: Self-pay | Admitting: Hematology

## 2019-02-24 DIAGNOSIS — R627 Adult failure to thrive: Secondary | ICD-10-CM | POA: Diagnosis not present

## 2019-02-25 DIAGNOSIS — R627 Adult failure to thrive: Secondary | ICD-10-CM | POA: Diagnosis not present

## 2019-02-26 MED FILL — TEMAZEPAM 15 MG CAPSULE: 15 | 30 days supply | Qty: 30 | Fill #1

## 2019-02-28 ENCOUNTER — Telehealth: Payer: Self-pay | Admitting: Hematology

## 2019-02-28 DIAGNOSIS — Z789 Other specified health status: Secondary | ICD-10-CM | POA: Diagnosis not present

## 2019-02-28 DIAGNOSIS — M79642 Pain in left hand: Secondary | ICD-10-CM | POA: Diagnosis not present

## 2019-02-28 DIAGNOSIS — G5632 Lesion of radial nerve, left upper limb: Secondary | ICD-10-CM | POA: Diagnosis not present

## 2019-02-28 NOTE — Telephone Encounter (Signed)
Left message re next appointment 12/9. Patient to get updated schedule at 12/9 visit and/or check appointments via [place].

## 2019-03-01 MED FILL — BACLOFEN 10 MG TABS: 10 | 20 days supply | Qty: 60 | Fill #1

## 2019-03-01 NOTE — Progress Notes (Signed)
[facility]   [phone] Fax:([phone]   Clinic Follow up Note   Patient Care Team: Girtha Rm, NP-C as PCP - General (Family Medicine) 03/02/2019  CHIEF COMPLAINT: F/u gastric cancer and iron deficiency   SUMMARY OF ONCOLOGIC HISTORY: Oncology History Overview Note  Cancer Staging Malignant neoplasm of stomach ([place]) Staging form: Stomach, AJCC 8th Edition - Pathologic stage from 10/04/2018: Stage IA (pT1a, pN0, cM0) - Signed by Truitt Merle, MD on 10/04/2018    Malignant neoplasm of stomach ([place])  07/23/2017 Initial Biopsy   07/23/17 EGD revealed malignant gastric ulcer. 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   07/27/2017 Initial Diagnosis   Malignant neoplasm of stomach ([place])   07/28/2017 Imaging   CT CAP W Contrast 07/28/17 [facility] IMPRESSION: 1. The primary gastric lesion is not readily apparent on this study. 2. Borderline enlarged gastrohepatic ligament node measures 1 cm. Additionally there is a ovoid soft tissue attenuating nodule within the left periaortic retroperitoneum which is indeterminate. Periaortic adenopathy cannot be excluded. Consider further evaluation with PET-CT. 3. Tiny nonspecific nodule in the left upper lobe measures 4 mm.   07/30/2017 PET scan   PET 07/30/17 [facility] IMPRESSION: 1. No hypermetabolic mass or adenopathy to suggest metastatic disease. 2. Low level FDG uptake associated with the periaortic left retroperitoneal nodule compatible with a benign etiology. 3. 4 mm left upper lobe lung nodule is too small to characterize by PET-CT.   08/19/2017 Surgery   Total Gastrectomy 08/19/2017 at [facility]   08/19/2017 Pathology Results   FINAL PATHOLOGIC DIAGNOSIS from [facility] MICROSCOPIC EXAMINATION AND DIAGNOSIS  A.  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 The tumor invades into the lamina propria. Angiolymphatic invasion is not identified. Perineural invasion is not identified.       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       Glandular mucosa with intestinal metaplasia in the distal esophagus consistent with specialized Barrett's mucosa; negative for dysplasia.       Multiple (49) perigastric lymph nodes are negative for tumor (0/49).       Squamous esophageal mucosa at the proximal surgical margin; negative for tumor.       Duodenal mucosa at the distal surgical margin; negative for tumor,       All surgical margins are negative for tumor (minimum tumor free margin, 7.0 cm, distal surgical margin).  B.  LYMPH NODES, HEPATIC ARTERY, EXCISION:      Multiple (8) hepatic artery lymph nodes are negative for tumor (0/8).  C.  LYMPH NODES, SPLENIC ARTERY, EXCISION:      Multiple (2) splenic artery lymph nodes are negative for    12/07/2017 Imaging   CT CAP W Constrast 12/07/17 [facility] IMPRESSION: 1. Status post gastrectomy, without residual or recurrent disease. 2. Left periaortic adenopathy, felt to be similar to the prior exam. Not FDG avid on interval PE T. Favored to be reactive. Recommend attention on follow-up. 3.  Aortic Atherosclerosis (ICD10-I70.0).  This is age advanced. 4. Tiny bilateral pulmonary nodules are unchanged, favoring a benign etiology.   12/10/2017 Mammogram   Diagnostic Mammogram 12/10/17 [facility] FINDINGS:  Mammographically, there is a group of punctate calcifications in the retroareolar right breast anterior third. Oval mass with circumscribed margins in the outer central aspect of the left breast middle  third. On physical examination, there is no palpable abnormality in the left breast at the area of mammographic concern. On targeted ultrasound, there is no sonographic correlate for the mammographic finding. Incidental intramammary lymph node  measuring 8 x 6 x 2 mm at the 3:00 position of the left breast 4 cm from the nipple.   02/26/2018 Imaging   CT AP W contrast 02/26/18 [facility] IMPRESSION: 1. Interval improvement or near complete resolution of the previously seen colitis involving the transverse colon. 2. Mildly thickened segment of small bowel in the left hemipelvis concerning for enteritis. Clinical correlation is recommended. No bowel obstruction. Normal appendix. 3. Large amount of stool throughout the colon. 4. A 3.5 cm left ovarian complex/hemorrhagic cyst. Ultrasound may provide better evaluation of the pelvic structures. Probable right ovarian corpus luteum.    03/02/2018 Procedure   On 03/02/18 EGD performed for dysphagia, esophageal discomfort and abdominal pain and showed mild stricture at EG junction, dilation was performed. Otherwise it was a normal EGD.  FINAL PATHOLOGIC DIAGNOSIS ESOPHAGUS, ENDOSCOPIC BIOPSY:      MILD CHRONIC INFLAMMATION.      NO EVIDENCE OF EOSINOPHILIC ESOPHAGITIS.   03/03/2018 Imaging   MRI AP 03/03/18  [facility]  IMPRESSION: Previous gastrectomy. No evidence of recurrent or metastatic disease within the abdomen or pelvis.  Interval resolution of left ovarian hemorrhagic cyst.   06/07/2018 Imaging   06/07/2018 CT Chest Abdomen and Pelvis [facility] IMPRESSION: 1. No evidence of local tumor recurrence status post distal gastrectomy. 2. No findings suspicious for metastatic disease in the chest, abdomen or pelvis. 3. Previously questioned left para-aortic adenopathy represents a benign venous variant in this patient with a circumaortic left renal vein. 4. Previously described tiny left pulmonary nodules are stable since 07/28/2017 CT and are probably benign. 5. Aortic Atherosclerosis (ICD10-I70.0).   06/11/2018 Mammogram   Diagnostic Mammogram 06/11/18  FINDINGS:  Unchanged group of punctate calcifications in the retroareolar right breast, anterior third. Unchanged oval equal density  mass with circumscribed margins in the outer central aspect of the left breast, middle third. No new suspicious mass, distortion or calcifications in either breast.   10/04/2018 Cancer Staging   Staging form: Stomach, AJCC 8th Edition - Pathologic stage from 10/04/2018: Stage IA (pT1a, pN0, cM0) - Signed by Truitt Merle, MD on 10/04/2018     CURRENT THERAPY:  1. Cancer surveillance 2. Pending start of iron therapy, insurance approved venofer   INTERVAL HISTORY: Brooke Austin returns for f/u and iron infusion as scheduled. She is weak. She has gained some weight, continues TPN. She saw NP at [facility] last week. She has been referred to pain clinic but appt not until next week. She is out of pain meds, abd pain is 7/10 today. She takes oxycodone BID and tramadol 1-2 times daily PRN. Pelvic floor PT is improving constipation. She has mild nausea but afraid to take zofran due to side effects. She had regular monthly menses until last surgery in 10/2018 when periods stopped for 3-4 months afterward. Recently started again. Bleeding lasts 5 days, heavy for first 3. Denies other bleeding such as GI bleeding or epistaxis. She did not tolerate oral iron in the past, caused GI upset. She had IV iron in [place] after surgery last year, tolerated well. Otherwise, denies fever, chills, cough, chest pain, dyspnea, or new issues.    MEDICAL HISTORY:  Past Medical History:  Diagnosis Date  . Cancer ([place]) 2019   Gastric Stage 1A  . Colitis  SURGICAL HISTORY: Past Surgical History:  Procedure Laterality Date  . breast surgery    . GASTRECTOMY  2019    I have reviewed the social history and family history with the patient and they are unchanged from previous note.  ALLERGIES:  is allergic to melatonin and zolpidem.  MEDICATIONS:  Current Outpatient Medications  Medication Sig Dispense Refill  . baclofen (LIORESAL) 10 MG tablet Take 10 mg by mouth 3 (three) times daily.     . baclofen (LIORESAL) 10 MG  tablet TAKE 1/2 TO 1 TABLET (5-10 MG TOTAL) BY MOUTH 3 (THREE) TIMES DAILY AS NEEDED FOR MUSCLE SPASMS. 30 tablet 1  . bisacodyl (DULCOLAX) 10 MG suppository Place 10 mg rectally daily as needed for mild constipation or moderate constipation.     . celecoxib (CELEBREX) 50 MG capsule Take 50 mg by mouth daily.    . cyanocobalamin (,VITAMIN B-12,) 1000 MCG/ML injection Inject 1 mL (1,000 mcg total) into the muscle every 30 (thirty) days. 1 mL 5  . Oxycodone HCl 10 MG TABS Take 1 tablet (10 mg total) by mouth every 12 (twelve) hours as needed (pain). 14 tablet 0  . PARoxetine (PAXIL) 20 MG tablet Take 20 mg by mouth daily.    . polyethylene glycol (MIRALAX / GLYCOLAX) 17 g packet Take 17 g by mouth 2 (two) times a day.    . pregabalin (LYRICA) 50 MG capsule Take 50 mg by mouth 3 (three) times daily.    Marland Kitchen senna-docusate (SENOKOT-S) 8.6-50 MG tablet Take 1 tablet by mouth at bedtime as needed for mild constipation.     . Syringe/Needle, Disp, (SYRINGE 3CC/23GX1") 23G X 1" 3 ML MISC 1 Syringe by Does not apply route every 30 (thirty) days. 1 each 5  . temazepam (RESTORIL) 15 MG capsule Take 1 capsule (15 mg total) by mouth at bedtime. 30 capsule 0  . traMADol (ULTRAM) 50 MG tablet Take 1 tablet (50 mg total) by mouth 3 (three) times daily as needed. 21 tablet 0  . alteplase (CATHFLO ACTIVASE) 2 MG injection 2 mg by Intracatheter route.    . hydrocortisone (ANUSOL-HC) 25 MG suppository Place 25 mg rectally daily as needed.    . ondansetron (ZOFRAN-ODT) 4 MG disintegrating tablet Take 4 mg by mouth every 8 (eight) hours as needed for nausea or vomiting.      No current facility-administered medications for this visit.     PHYSICAL EXAMINATION: ECOG PERFORMANCE STATUS: 1 - Symptomatic but completely ambulatory  Vitals:   03/02/19 0831  BP: 121/67  Pulse: 71  Resp: 18  Temp: 98.5 F (36.9 C)  SpO2: 100%   Filed Weights   03/02/19 0831  Weight: 125 lb 14.4 oz (57.1 kg)    GENERAL:alert, no  distress and comfortable SKIN: no rash  EYES: sclera clear LUNGS: clear with normal breathing effort HEART: regular rate & rhythm, no lower extremity edema ABDOMEN:abdomen soft with normal bowel sounds, tender near midline incision  NEURO: alert & oriented x 3 with fluent speech, normal gait  LABORATORY DATA:  I have reviewed the data as listed CBC Latest Ref Rng & Units 03/02/2019 02/04/2019 12/07/2018  WBC 4.0 - 10.5 K/uL 5.6 6.4 -  Hemoglobin 12.0 - 15.0 g/dL 10.6(L) 10.3(L) 9.5(L)  Hematocrit 36.0 - 46.0 % 33.7(L) 32.7(L) 28.0(L)  Platelets 150 - 400 K/uL 546(H) 516(H) -     CMP Latest Ref Rng & Units 02/04/2019 12/07/2018 12/07/2018  Glucose 70 - 99 mg/dL 72 96 99  BUN 6 -  20 mg/dL 19 13 13   Creatinine 0.44 - 1.00 mg/dL 0.69 0.60 0.71  Sodium 135 - 145 mmol/L 142 135 135  Potassium 3.5 - 5.1 mmol/L 3.9 4.2 4.2  Chloride 98 - 111 mmol/L 109 100 101  CO2 22 - 32 mmol/L 25 - 26  Calcium 8.9 - 10.3 mg/dL 8.2(L) - 7.7(L)  Total Protein 6.5 - 8.1 g/dL 6.5 - 4.4(L)  Total Bilirubin 0.3 - 1.2 mg/dL <0.2(L) - 0.2(L)  Alkaline Phos 38 - 126 U/L 94 - 83  AST 15 - 41 U/L 17 - 29  ALT 0 - 44 U/L 19 - 23      RADIOGRAPHIC STUDIES: I have personally reviewed the radiological images as listed and agreed with the findings in the report. No results found.   ASSESSMENT & PLAN: 48 yo female with   1. Iron deficiency and B12 deficiency anemia, thrombocytosis  -secondary to poor absorption from gastrectomy and subsequent intussusception reduction, and malnutrition -menorrhagia may also contribute to IDA -she had a colonoscopy in 08/2018, she reports a polyp was removed; I do not have the report. She denies obvious GI bleeding. Will check stool card  -today's labs show Hg 10.6, Ferritin 5, serum iron 25, TIBC 433, 6% transferrin saturation, and UIBC 408 -thrombocytisis is likely reactive to IDA  -she previously did not tolerate oral iron, and received IV iron after gastrectomy per patient,  tolerated infusion well -we discussed risks and benefits of IV Venofer including mild to severe reaction such as anaphylaxis. She agrees to proceed.  -will proceed with IV Venofer today and weekly x4, will check labs in 2, 3, and 4 months to evaluate her response and give additional iron as needed   -she is on monthly B12 inj at home, continue   2.Maulnutrion, Nausea,vomiting, Constipation, Acid Reflux -since gastrectomy -on TPN for nutrition and supportive meds -constipation improving with pelvic floor PT, continue -she is reluctant to try zofran, her mother took it while she had cancer and had neuro side effect.  -she agrees to try low dose zofran ODT 4 mg PRN but will stop if she has side effects.   3. Gastric cancer, stage I, pT1aN0M0 -diagnosed 07/2017 s/p total gastrectomy due to family h/o gastric cancer. She did not require adjuvant chemo for early stage disease, complete resection, and likely low recurrence risk  -colonoscopy in 08/2018 was unremarkable except colon polyp per patient, I do not have the report. Patient is not sure where she had this done -CT AP in 10/2018 for acute abdominal pain from small bowel intussusception at j-j anastomosis s/p surgical reduction, otherwise NED -continue cancer surveillance and f/u with [place] and Dr. Crisoforo Oxford -She has ongoing pain near her surgical incision, managed on oxycodone and tramadol PRN. I refilled for her while she waits for pain clinic appointment which will be next week.  -today's exam shows no concern for recurrence -next routine surveillance visit next summer 2021, or sooner if needed for new or worsening issues   PLAN: -Labs reviewed -Iron deficiency anemia most likely related to gastrectomy and subsequent intestinal surgeries, malnutrition, and possibly some component of menorrhagia  -Proceed with IV venofer today, weekly x4 - she consented  -Stool card given to patient, will return next week with second venofer infusion  -Labs in 2,  3, and 4 months -Additional iron as needed  -Will try to obtain colonoscopy report from [facility] or [facility], patient not sure where it was done -Refilled oxycodone and tramadol for 1  week until pain clinic appt   All questions were answered. The patient knows to call the clinic with any problems, questions or concerns. No barriers to learning was detected. I spent 20 minutes counseling the patient face to face. The total time spent in the appointment was 25 minutes and more than 50% was on counseling and review of test results     Brooke Feeling, NP 03/02/19

## 2019-03-02 ENCOUNTER — Other Ambulatory Visit: Payer: Self-pay

## 2019-03-02 ENCOUNTER — Encounter: Payer: Self-pay | Admitting: Nurse Practitioner

## 2019-03-02 ENCOUNTER — Inpatient Hospital Stay: Payer: 59

## 2019-03-02 ENCOUNTER — Inpatient Hospital Stay: Payer: 59 | Attending: Hematology | Admitting: Nurse Practitioner

## 2019-03-02 VITALS — BP 118/79 | HR 63 | Temp 98.7°F | Resp 16

## 2019-03-02 VITALS — BP 121/67 | HR 71 | Temp 98.5°F | Resp 18 | Ht 68.0 in | Wt 125.9 lb

## 2019-03-02 DIAGNOSIS — K561 Intussusception: Secondary | ICD-10-CM | POA: Diagnosis not present

## 2019-03-02 DIAGNOSIS — D519 Vitamin B12 deficiency anemia, unspecified: Secondary | ICD-10-CM | POA: Diagnosis not present

## 2019-03-02 DIAGNOSIS — D509 Iron deficiency anemia, unspecified: Secondary | ICD-10-CM | POA: Diagnosis not present

## 2019-03-02 DIAGNOSIS — D508 Other iron deficiency anemias: Secondary | ICD-10-CM

## 2019-03-02 DIAGNOSIS — Z85028 Personal history of other malignant neoplasm of stomach: Secondary | ICD-10-CM

## 2019-03-02 DIAGNOSIS — R627 Adult failure to thrive: Secondary | ICD-10-CM | POA: Diagnosis not present

## 2019-03-02 DIAGNOSIS — C161 Malignant neoplasm of fundus of stomach: Secondary | ICD-10-CM

## 2019-03-02 LAB — CBC WITH DIFFERENTIAL (CANCER CENTER ONLY)
Abs Immature Granulocytes: 0.01 10*3/uL (ref 0.00–0.07)
Basophils Absolute: 0.1 10*3/uL (ref 0.0–0.1)
Basophils Relative: 1 %
Eosinophils Absolute: 0.4 10*3/uL (ref 0.0–0.5)
Eosinophils Relative: 6 %
HCT: 33.7 % — ABNORMAL LOW (ref 36.0–46.0)
Hemoglobin: 10.6 g/dL — ABNORMAL LOW (ref 12.0–15.0)
Immature Granulocytes: 0 %
Lymphocytes Relative: 30 %
Lymphs Abs: 1.7 10*3/uL (ref 0.7–4.0)
MCH: 28.4 pg (ref 26.0–34.0)
MCHC: 31.5 g/dL (ref 30.0–36.0)
MCV: 90.3 fL (ref 80.0–100.0)
Monocytes Absolute: 0.4 10*3/uL (ref 0.1–1.0)
Monocytes Relative: 6 %
Neutro Abs: 3.1 10*3/uL (ref 1.7–7.7)
Neutrophils Relative %: 57 %
Platelet Count: 546 10*3/uL — ABNORMAL HIGH (ref 150–400)
RBC: 3.73 MIL/uL — ABNORMAL LOW (ref 3.87–5.11)
RDW: 13.2 % (ref 11.5–15.5)
WBC Count: 5.6 10*3/uL (ref 4.0–10.5)
nRBC: 0 % (ref 0.0–0.2)

## 2019-03-02 LAB — FERRITIN: Ferritin: 5 ng/mL — ABNORMAL LOW (ref 11–307)

## 2019-03-02 LAB — IRON AND TIBC
Iron: 25 ug/dL — ABNORMAL LOW (ref 41–142)
Saturation Ratios: 6 % — ABNORMAL LOW (ref 21–57)
TIBC: 433 ug/dL (ref 236–444)
UIBC: 408 ug/dL — ABNORMAL HIGH (ref 120–384)

## 2019-03-02 LAB — VITAMIN D 25 HYDROXY (VIT D DEFICIENCY, FRACTURES): Vit D, 25-Hydroxy: 30.66 ng/mL (ref 30–100)

## 2019-03-02 MED ORDER — DIPHENHYDRAMINE HCL 25 MG PO CAPS
25.0000 mg | ORAL_CAPSULE | Freq: Once | ORAL | Status: AC
  Administered 2019-03-02: 25 mg via ORAL

## 2019-03-02 MED ORDER — ACETAMINOPHEN 325 MG PO TABS
ORAL_TABLET | ORAL | Status: AC
  Filled 2019-03-02: qty 2

## 2019-03-02 MED ORDER — SODIUM CHLORIDE 0.9 % IV SOLN
Freq: Once | INTRAVENOUS | Status: AC
  Administered 2019-03-02: 10:00:00 via INTRAVENOUS
  Filled 2019-03-02: qty 250

## 2019-03-02 MED ORDER — SODIUM CHLORIDE 0.9 % IV SOLN
200.0000 mg | Freq: Once | INTRAVENOUS | Status: AC
  Administered 2019-03-02: 200 mg via INTRAVENOUS
  Filled 2019-03-02: qty 10

## 2019-03-02 MED ORDER — ACETAMINOPHEN 325 MG PO TABS
650.0000 mg | ORAL_TABLET | Freq: Once | ORAL | Status: AC
  Administered 2019-03-02: 650 mg via ORAL

## 2019-03-02 MED ORDER — OXYCODONE HCL 10 MG PO TABS: 10.0000 mg | ORAL_TABLET | Freq: Two times a day (BID) | ORAL | 0 refills | Status: DC | PRN

## 2019-03-02 MED ORDER — DIPHENHYDRAMINE HCL 25 MG PO CAPS
ORAL_CAPSULE | ORAL | Status: AC
  Filled 2019-03-02: qty 1

## 2019-03-02 MED ORDER — TRAMADOL HCL 50 MG PO TABS: 50.0000 mg | ORAL_TABLET | Freq: Three times a day (TID) | ORAL | 0 refills | Status: DC | PRN

## 2019-03-02 MED ORDER — SODIUM CHLORIDE 0.9 % IV SOLN
200.0000 mg | Freq: Once | INTRAVENOUS | Status: DC
  Filled 2019-03-02: qty 10

## 2019-03-02 MED FILL — oxyCODONE HCL 10 MG TABS: 10 | 7 days supply | Qty: 14 | Fill #0

## 2019-03-02 MED FILL — traMADol HCL 50 MG TABS: 50 | 7 days supply | Qty: 21 | Fill #0

## 2019-03-02 NOTE — Patient Instructions (Signed)
Iron Sucrose injection What is this medicine? IRON SUCROSE (AHY ern SOO krohs) is an iron complex. Iron is used to make healthy red blood cells, which carry oxygen and nutrients throughout the body. This medicine is used to treat iron deficiency anemia in people with chronic kidney disease. This medicine may be used for other purposes; ask your health care provider or pharmacist if you have questions. COMMON BRAND NAME(S): Venofer What should I tell my health care provider before I take this medicine? They need to know if you have any of these conditions:  anemia not caused by low iron levels  heart disease  high levels of iron in the blood  kidney disease  liver disease  an unusual or allergic reaction to iron, other medicines, foods, dyes, or preservatives  pregnant or trying to get pregnant  breast-feeding How should I use this medicine? This medicine is for infusion into a vein. It is given by a health care professional in a hospital or clinic setting. Talk to your pediatrician regarding the use of this medicine in children. While this drug may be prescribed for children as young as 2 years for selected conditions, precautions do apply. Overdosage: If you think you have taken too much of this medicine contact a poison control center or emergency room at once. NOTE: This medicine is only for you. Do not share this medicine with others. What if I miss a dose? It is important not to miss your dose. Call your doctor or health care professional if you are unable to keep an appointment. What may interact with this medicine? Do not take this medicine with any of the following medications:  deferoxamine  dimercaprol  other iron products This medicine may also interact with the following medications:  chloramphenicol  deferasirox This list may not describe all possible interactions. Give your health care provider a list of all the medicines, herbs, non-prescription drugs, or  dietary supplements you use. Also tell them if you smoke, drink alcohol, or use illegal drugs. Some items may interact with your medicine. What should I watch for while using this medicine? Visit your doctor or healthcare professional regularly. Tell your doctor or healthcare professional if your symptoms do not start to get better or if they get worse. You may need blood work done while you are taking this medicine. You may need to follow a special diet. Talk to your doctor. Foods that contain iron include: whole grains/cereals, dried fruits, beans, or peas, leafy green vegetables, and organ meats (liver, kidney). What side effects may I notice from receiving this medicine? Side effects that you should report to your doctor or health care professional as soon as possible:  allergic reactions like skin rash, itching or hives, swelling of the face, lips, or tongue  breathing problems  changes in blood pressure  cough  fast, irregular heartbeat  feeling faint or lightheaded, falls  fever or chills  flushing, sweating, or hot feelings  joint or muscle aches/pains  seizures  swelling of the ankles or feet  unusually weak or tired Side effects that usually do not require medical attention (report to your doctor or health care professional if they continue or are bothersome):  diarrhea  feeling achy  headache  irritation at site where injected  nausea, vomiting  stomach upset  tiredness This list may not describe all possible side effects. Call your doctor for medical advice about side effects. You may report side effects to FDA at 1-800-FDA-1088. Where should I keep   my medicine? This drug is given in a hospital or clinic and will not be stored at home. NOTE: This sheet is a summary. It may not cover all possible information. If you have questions about this medicine, talk to your doctor, pharmacist, or health care provider.  2020 Elsevier/Gold Standard (2010-12-19  17:14:35) Coronavirus (COVID-19) Are you at risk?  Are you at risk for the Coronavirus (COVID-19)?  To be considered HIGH RISK for Coronavirus (COVID-19), you have to meet the following criteria:  . Traveled to [place], [place], [place], [place] or [place]; or in the [place] to [place], [place], [place], or [place]; and have fever, cough, and shortness of breath within the last 2 weeks of travel OR . Been in close contact with a person diagnosed with COVID-19 within the last 2 weeks and have fever, cough, and shortness of breath . IF YOU DO NOT MEET THESE CRITERIA, YOU ARE CONSIDERED LOW RISK FOR COVID-19.  What to do if you are HIGH RISK for COVID-19?  . If you are having a medical emergency, call 911. . Seek medical care right away. Before you go to a doctor's office, urgent care or emergency department, call ahead and tell them about your recent travel, contact with someone diagnosed with COVID-19, and your symptoms. You should receive instructions from your physician's office regarding next steps of care.  . When you arrive at healthcare provider, tell the healthcare staff immediately you have returned from visiting [place], [place], [place], [place] or [place]; or traveled in the [place] to [place], [place], [place], or [place]; in the last two weeks or you have been in close contact with a person diagnosed with COVID-19 in the last 2 weeks.   . Tell the health care staff about your symptoms: fever, cough and shortness of breath. . After you have been seen by a medical provider, you will be either: o Tested for (COVID-19) and discharged home on quarantine except to seek medical care if symptoms worsen, and asked to  - Stay home and avoid contact with others until you get your results (4-5 days)  - Avoid travel on public transportation if possible (such as bus, train, or airplane) or o Sent to the Emergency Department by EMS for evaluation, COVID-19 testing, and  possible admission depending on your condition and test results.  What to do if you are LOW RISK for COVID-19?  Reduce your risk of any infection by using the same precautions used for avoiding the common cold or flu:  . Wash your hands often with soap and warm water for at least 20 seconds.  If soap and water are not readily available, use an alcohol-based hand sanitizer with at least 60% alcohol.  . If coughing or sneezing, cover your mouth and nose by coughing or sneezing into the elbow areas of your shirt or coat, into a tissue or into your sleeve (not your hands). . Avoid shaking hands with others and consider head nods or verbal greetings only. . Avoid touching your eyes, nose, or mouth with unwashed hands.  . Avoid close contact with people who are sick. . Avoid places or events with large numbers of people in one location, like concerts or sporting events. . Carefully consider travel plans you have or are making. . If you are planning any travel outside or inside the [place], visit the CDC's Travelers' Health webpage for the latest health notices. . If you have some symptoms but not all   symptoms, continue to monitor at home and seek medical attention if your symptoms worsen. . If you are having a medical emergency, call 911.   ADDITIONAL HEALTHCARE OPTIONS FOR PATIENTS  [facility] / e-Visit: [email or web address]         [facility]: [phone]  [facility]: [phone]                   [facility]: [phone]  

## 2019-03-03 ENCOUNTER — Telehealth: Payer: Self-pay | Admitting: Nurse Practitioner

## 2019-03-03 DIAGNOSIS — R627 Adult failure to thrive: Secondary | ICD-10-CM | POA: Diagnosis not present

## 2019-03-03 NOTE — Telephone Encounter (Signed)
Scheduled appt per 12/9 los. ° °Left a vm of the appt date and time. °

## 2019-03-04 ENCOUNTER — Ambulatory Visit: Payer: 59 | Admitting: Hematology

## 2019-03-04 DIAGNOSIS — E538 Deficiency of other specified B group vitamins: Secondary | ICD-10-CM | POA: Diagnosis not present

## 2019-03-04 DIAGNOSIS — G4700 Insomnia, unspecified: Secondary | ICD-10-CM | POA: Diagnosis not present

## 2019-03-04 DIAGNOSIS — K921 Melena: Secondary | ICD-10-CM | POA: Diagnosis not present

## 2019-03-04 DIAGNOSIS — F419 Anxiety disorder, unspecified: Secondary | ICD-10-CM | POA: Diagnosis not present

## 2019-03-07 DIAGNOSIS — G5632 Lesion of radial nerve, left upper limb: Secondary | ICD-10-CM | POA: Diagnosis not present

## 2019-03-07 DIAGNOSIS — M79642 Pain in left hand: Secondary | ICD-10-CM | POA: Diagnosis not present

## 2019-03-07 DIAGNOSIS — Z789 Other specified health status: Secondary | ICD-10-CM | POA: Diagnosis not present

## 2019-03-07 DIAGNOSIS — M6289 Other specified disorders of muscle: Secondary | ICD-10-CM | POA: Diagnosis not present

## 2019-03-08 DIAGNOSIS — R627 Adult failure to thrive: Secondary | ICD-10-CM | POA: Diagnosis not present

## 2019-03-08 DIAGNOSIS — K561 Intussusception: Secondary | ICD-10-CM | POA: Diagnosis not present

## 2019-03-09 ENCOUNTER — Other Ambulatory Visit: Payer: Self-pay

## 2019-03-09 ENCOUNTER — Inpatient Hospital Stay: Payer: 59

## 2019-03-09 VITALS — BP 120/71 | HR 75 | Temp 98.9°F | Resp 17 | Wt 126.2 lb

## 2019-03-09 DIAGNOSIS — D519 Vitamin B12 deficiency anemia, unspecified: Secondary | ICD-10-CM | POA: Diagnosis not present

## 2019-03-09 DIAGNOSIS — Z5181 Encounter for therapeutic drug level monitoring: Secondary | ICD-10-CM | POA: Diagnosis not present

## 2019-03-09 DIAGNOSIS — M5416 Radiculopathy, lumbar region: Secondary | ICD-10-CM | POA: Diagnosis not present

## 2019-03-09 DIAGNOSIS — M48062 Spinal stenosis, lumbar region with neurogenic claudication: Secondary | ICD-10-CM | POA: Diagnosis not present

## 2019-03-09 DIAGNOSIS — D509 Iron deficiency anemia, unspecified: Secondary | ICD-10-CM | POA: Diagnosis not present

## 2019-03-09 DIAGNOSIS — C169 Malignant neoplasm of stomach, unspecified: Secondary | ICD-10-CM | POA: Diagnosis not present

## 2019-03-09 DIAGNOSIS — R109 Unspecified abdominal pain: Secondary | ICD-10-CM | POA: Diagnosis not present

## 2019-03-09 DIAGNOSIS — Z9889 Other specified postprocedural states: Secondary | ICD-10-CM | POA: Diagnosis not present

## 2019-03-09 DIAGNOSIS — D508 Other iron deficiency anemias: Secondary | ICD-10-CM

## 2019-03-09 DIAGNOSIS — L905 Scar conditions and fibrosis of skin: Secondary | ICD-10-CM | POA: Diagnosis not present

## 2019-03-09 DIAGNOSIS — Z903 Acquired absence of stomach [part of]: Secondary | ICD-10-CM | POA: Diagnosis not present

## 2019-03-09 MED ORDER — SODIUM CHLORIDE 0.9% FLUSH
10.0000 mL | Freq: Once | INTRAVENOUS | Status: AC | PRN
  Administered 2019-03-09: 16:00:00 10 mL
  Filled 2019-03-09: qty 10

## 2019-03-09 MED ORDER — DIPHENHYDRAMINE HCL 25 MG PO CAPS
25.0000 mg | ORAL_CAPSULE | Freq: Once | ORAL | Status: AC
  Administered 2019-03-09: 25 mg via ORAL

## 2019-03-09 MED ORDER — HEPARIN SOD (PORK) LOCK FLUSH 100 UNIT/ML IV SOLN
250.0000 [IU] | Freq: Once | INTRAVENOUS | Status: AC | PRN
  Administered 2019-03-09: 250 [IU]
  Filled 2019-03-09: qty 5

## 2019-03-09 MED ORDER — ACETAMINOPHEN 325 MG PO TABS
650.0000 mg | ORAL_TABLET | Freq: Once | ORAL | Status: AC
  Administered 2019-03-09: 650 mg via ORAL

## 2019-03-09 MED ORDER — SODIUM CHLORIDE 0.9 % IV SOLN
200.0000 mg | Freq: Once | INTRAVENOUS | Status: AC
  Administered 2019-03-09: 15:00:00 200 mg via INTRAVENOUS
  Filled 2019-03-09: qty 10

## 2019-03-09 MED ORDER — DIPHENHYDRAMINE HCL 25 MG PO CAPS
ORAL_CAPSULE | ORAL | Status: AC
  Filled 2019-03-09: qty 1

## 2019-03-09 MED ORDER — SODIUM CHLORIDE 0.9 % IV SOLN
Freq: Once | INTRAVENOUS | Status: AC
  Filled 2019-03-09: qty 250

## 2019-03-09 MED ORDER — IRON SUCROSE 20 MG/ML IV SOLN
200.0000 mg | Freq: Once | INTRAVENOUS | Status: DC
  Filled 2019-03-09: qty 10

## 2019-03-09 MED ORDER — SODIUM CHLORIDE 0.9 % IV SOLN: 200.0000 mg | Freq: Once | INTRAVENOUS | Status: DC

## 2019-03-09 MED ORDER — ACETAMINOPHEN 325 MG PO TABS
ORAL_TABLET | ORAL | Status: AC
  Filled 2019-03-09: qty 2

## 2019-03-09 NOTE — Patient Instructions (Signed)

## 2019-03-10 ENCOUNTER — Telehealth: Payer: Self-pay

## 2019-03-10 DIAGNOSIS — R627 Adult failure to thrive: Secondary | ICD-10-CM | POA: Diagnosis not present

## 2019-03-10 NOTE — Telephone Encounter (Signed)
I asked patient to call me back and let me know who did her last colonscopy.  She states she will.

## 2019-03-14 ENCOUNTER — Telehealth: Payer: Self-pay

## 2019-03-14 NOTE — Telephone Encounter (Signed)
Received voicemail form patient stating that her hemroids were bleeding and that they were very painful. Cira Rue NP made aware

## 2019-03-15 DIAGNOSIS — R627 Adult failure to thrive: Secondary | ICD-10-CM | POA: Diagnosis not present

## 2019-03-15 DIAGNOSIS — K921 Melena: Secondary | ICD-10-CM | POA: Diagnosis not present

## 2019-03-15 DIAGNOSIS — Z8719 Personal history of other diseases of the digestive system: Secondary | ICD-10-CM | POA: Diagnosis not present

## 2019-03-15 DIAGNOSIS — K625 Hemorrhage of anus and rectum: Secondary | ICD-10-CM | POA: Diagnosis not present

## 2019-03-15 DIAGNOSIS — C160 Malignant neoplasm of cardia: Secondary | ICD-10-CM | POA: Diagnosis not present

## 2019-03-15 DIAGNOSIS — R109 Unspecified abdominal pain: Secondary | ICD-10-CM | POA: Diagnosis not present

## 2019-03-15 DIAGNOSIS — K561 Intussusception: Secondary | ICD-10-CM | POA: Diagnosis not present

## 2019-03-16 ENCOUNTER — Inpatient Hospital Stay: Payer: 59

## 2019-03-16 ENCOUNTER — Other Ambulatory Visit: Payer: Self-pay

## 2019-03-16 VITALS — BP 125/78 | HR 72 | Temp 98.2°F | Resp 16

## 2019-03-16 DIAGNOSIS — D509 Iron deficiency anemia, unspecified: Secondary | ICD-10-CM | POA: Diagnosis not present

## 2019-03-16 DIAGNOSIS — D519 Vitamin B12 deficiency anemia, unspecified: Secondary | ICD-10-CM | POA: Diagnosis not present

## 2019-03-16 DIAGNOSIS — D508 Other iron deficiency anemias: Secondary | ICD-10-CM

## 2019-03-16 MED ORDER — HEPARIN SOD (PORK) LOCK FLUSH 100 UNIT/ML IV SOLN
250.0000 [IU] | Freq: Once | INTRAVENOUS | Status: AC | PRN
  Administered 2019-03-16: 250 [IU]
  Filled 2019-03-16: qty 5

## 2019-03-16 MED ORDER — ACETAMINOPHEN 325 MG PO TABS
650.0000 mg | ORAL_TABLET | Freq: Once | ORAL | Status: AC
  Administered 2019-03-16: 650 mg via ORAL

## 2019-03-16 MED ORDER — DIPHENHYDRAMINE HCL 25 MG PO CAPS
ORAL_CAPSULE | ORAL | Status: AC
  Filled 2019-03-16: qty 1

## 2019-03-16 MED ORDER — SODIUM CHLORIDE 0.9 % IV SOLN
200.0000 mg | Freq: Once | INTRAVENOUS | Status: AC
  Administered 2019-03-16: 200 mg via INTRAVENOUS
  Filled 2019-03-16: qty 10

## 2019-03-16 MED ORDER — SODIUM CHLORIDE 0.9 % IV SOLN
Freq: Once | INTRAVENOUS | Status: AC
  Filled 2019-03-16: qty 250

## 2019-03-16 MED ORDER — ACETAMINOPHEN 325 MG PO TABS
ORAL_TABLET | ORAL | Status: AC
  Filled 2019-03-16: qty 2

## 2019-03-16 MED ORDER — DIPHENHYDRAMINE HCL 25 MG PO CAPS
25.0000 mg | ORAL_CAPSULE | Freq: Once | ORAL | Status: AC
  Administered 2019-03-16: 25 mg via ORAL

## 2019-03-16 MED ORDER — SODIUM CHLORIDE 0.9% FLUSH
10.0000 mL | Freq: Once | INTRAVENOUS | Status: AC | PRN
  Administered 2019-03-16: 10 mL
  Filled 2019-03-16: qty 10

## 2019-03-16 NOTE — Patient Instructions (Signed)

## 2019-03-17 DIAGNOSIS — R627 Adult failure to thrive: Secondary | ICD-10-CM | POA: Diagnosis not present

## 2019-03-21 DIAGNOSIS — Z789 Other specified health status: Secondary | ICD-10-CM | POA: Diagnosis not present

## 2019-03-21 DIAGNOSIS — M79642 Pain in left hand: Secondary | ICD-10-CM | POA: Diagnosis not present

## 2019-03-21 DIAGNOSIS — G5632 Lesion of radial nerve, left upper limb: Secondary | ICD-10-CM | POA: Diagnosis not present

## 2019-03-22 DIAGNOSIS — K529 Noninfective gastroenteritis and colitis, unspecified: Secondary | ICD-10-CM | POA: Diagnosis not present

## 2019-03-22 DIAGNOSIS — K561 Intussusception: Secondary | ICD-10-CM | POA: Diagnosis not present

## 2019-03-23 ENCOUNTER — Inpatient Hospital Stay: Payer: 59

## 2019-03-23 ENCOUNTER — Other Ambulatory Visit: Payer: Self-pay

## 2019-03-23 VITALS — BP 136/86 | HR 101 | Temp 98.5°F | Resp 16

## 2019-03-23 DIAGNOSIS — H538 Other visual disturbances: Secondary | ICD-10-CM | POA: Diagnosis not present

## 2019-03-23 DIAGNOSIS — D509 Iron deficiency anemia, unspecified: Secondary | ICD-10-CM | POA: Diagnosis not present

## 2019-03-23 DIAGNOSIS — R519 Headache, unspecified: Secondary | ICD-10-CM | POA: Diagnosis not present

## 2019-03-23 DIAGNOSIS — R9089 Other abnormal findings on diagnostic imaging of central nervous system: Secondary | ICD-10-CM | POA: Diagnosis not present

## 2019-03-23 DIAGNOSIS — D508 Other iron deficiency anemias: Secondary | ICD-10-CM

## 2019-03-23 DIAGNOSIS — E538 Deficiency of other specified B group vitamins: Secondary | ICD-10-CM | POA: Diagnosis not present

## 2019-03-23 DIAGNOSIS — D519 Vitamin B12 deficiency anemia, unspecified: Secondary | ICD-10-CM | POA: Diagnosis not present

## 2019-03-23 MED ORDER — DIPHENHYDRAMINE HCL 25 MG PO CAPS
ORAL_CAPSULE | ORAL | Status: AC
  Filled 2019-03-23: qty 1

## 2019-03-23 MED ORDER — DIPHENHYDRAMINE HCL 25 MG PO CAPS
25.0000 mg | ORAL_CAPSULE | Freq: Once | ORAL | Status: AC
  Administered 2019-03-23: 25 mg via ORAL

## 2019-03-23 MED ORDER — SODIUM CHLORIDE 0.9 % IV SOLN
200.0000 mg | Freq: Once | INTRAVENOUS | Status: AC
  Administered 2019-03-23: 200 mg via INTRAVENOUS
  Filled 2019-03-23: qty 10

## 2019-03-23 MED ORDER — SODIUM CHLORIDE 0.9 % IV SOLN
Freq: Once | INTRAVENOUS | Status: AC
  Filled 2019-03-23: qty 250

## 2019-03-23 MED ORDER — SODIUM CHLORIDE 0.9% FLUSH
10.0000 mL | Freq: Once | INTRAVENOUS | Status: AC | PRN
  Administered 2019-03-23: 10 mL
  Filled 2019-03-23: qty 10

## 2019-03-23 MED ORDER — ACETAMINOPHEN 325 MG PO TABS
650.0000 mg | ORAL_TABLET | Freq: Once | ORAL | Status: AC
  Administered 2019-03-23: 650 mg via ORAL

## 2019-03-23 MED ORDER — ACETAMINOPHEN 325 MG PO TABS
ORAL_TABLET | ORAL | Status: AC
  Filled 2019-03-23: qty 2

## 2019-03-23 MED ORDER — HEPARIN SOD (PORK) LOCK FLUSH 100 UNIT/ML IV SOLN
250.0000 [IU] | Freq: Once | INTRAVENOUS | Status: AC | PRN
  Administered 2019-03-23: 250 [IU]
  Filled 2019-03-23: qty 5

## 2019-03-23 MED FILL — PARoxetine HCL 20 MG TABS: 20 | 30 days supply | Qty: 30 | Fill #0

## 2019-03-23 NOTE — Patient Instructions (Signed)

## 2019-03-24 DIAGNOSIS — M48062 Spinal stenosis, lumbar region with neurogenic claudication: Secondary | ICD-10-CM | POA: Diagnosis not present

## 2019-03-24 DIAGNOSIS — R627 Adult failure to thrive: Secondary | ICD-10-CM | POA: Diagnosis not present

## 2019-03-24 DIAGNOSIS — M5416 Radiculopathy, lumbar region: Secondary | ICD-10-CM | POA: Diagnosis not present

## 2019-03-30 MED FILL — TEMAZEPAM 15 MG CAPSULE: 15 | 30 days supply | Qty: 30 | Fill #0

## 2019-03-31 MED FILL — BACLOFEN 10 MG TABS: 10 | 20 days supply | Qty: 60 | Fill #0

## 2019-04-01 ENCOUNTER — Encounter: Payer: 59 | Admitting: Obstetrics & Gynecology

## 2019-04-15 LAB — HM COLONOSCOPY

## 2019-04-20 MED FILL — CELECOXIB 50 MG CAPS: 50 | 30 days supply | Qty: 30 | Fill #0

## 2019-04-20 MED FILL — PREGABALIN 50 MG CAPS: 50 | 30 days supply | Qty: 60 | Fill #0

## 2019-04-23 MED FILL — TEMAZEPAM 15 MG CAPSULE: 15 | 30 days supply | Qty: 30 | Fill #1

## 2019-05-03 MED FILL — PARoxetine HCL 20 MG TABS: 20 | 30 days supply | Qty: 30 | Fill #1

## 2019-05-04 ENCOUNTER — Other Ambulatory Visit: Payer: 59

## 2019-05-05 ENCOUNTER — Telehealth: Payer: Self-pay | Admitting: Hematology

## 2019-05-05 NOTE — Telephone Encounter (Signed)
Returned patient's phone call regarding rescheduling or cancelling an appointment, left a voicemail.

## 2019-05-06 MED FILL — PARoxetine HCL 30 MG TABS: 30 | 90 days supply | Qty: 90 | Fill #0

## 2019-05-30 MED FILL — CELECOXIB 50 MG CAPS: 50 | 30 days supply | Qty: 30 | Fill #1

## 2019-05-30 MED FILL — PREGABALIN 50 MG CAPS: 50 | 30 days supply | Qty: 60 | Fill #1

## 2019-05-30 MED FILL — BACLOFEN 10 MG TABS: 10 | 20 days supply | Qty: 60 | Fill #1

## 2019-06-01 ENCOUNTER — Inpatient Hospital Stay: Payer: Federal, State, Local not specified - PPO | Attending: Hematology

## 2019-06-14 ENCOUNTER — Encounter: Payer: Self-pay | Admitting: Family Medicine

## 2019-06-17 ENCOUNTER — Ambulatory Visit: Payer: Self-pay | Attending: Internal Medicine

## 2019-06-17 DIAGNOSIS — Z23 Encounter for immunization: Secondary | ICD-10-CM

## 2019-06-17 NOTE — Progress Notes (Signed)
   Covid-19 Vaccination Clinic  Name:  Brooke Austin    MRN: [id number] DOB: 03-07-1971  06/17/2019  [name] was observed post Covid-19 immunization for 15 minutes without incident. She was provided with Vaccine Information Sheet and instruction to access the V-Safe system.   [name] was instructed to call 911 with any severe reactions post vaccine: Marland Kitchen Difficulty breathing  . Swelling of face and throat  . A fast heartbeat  . A bad rash all over body  . Dizziness and weakness   Immunizations Administered    Name Date Dose VIS Date Route   Pfizer COVID-19 Vaccine 06/17/2019 10:02 AM 0.3 mL 03/04/2019 Intramuscular   Manufacturer: [place]   Lot: [id number]   [place]: [id number]

## 2019-06-20 MED FILL — LIDOCAINE PATCH 5%: 5 | 30 days supply | Qty: 30 | Fill #0

## 2019-06-20 MED FILL — PANTOPRAZOLE SOD DR 40 MG T: 40 | 30 days supply | Qty: 30 | Fill #0

## 2019-06-20 MED FILL — CYANOCOBALAMIN 1,000 MCG/ML: 1000 | 90 days supply | Qty: 3 | Fill #0

## 2019-06-20 MED FILL — TEMAZEPAM 15 MG CAPSULE: 15 | 30 days supply | Qty: 30 | Fill #0

## 2019-06-20 MED FILL — DICLOFENAC SODIUM 1 % GEL: 1 | 25 days supply | Qty: 100 | Fill #0

## 2019-06-21 MED FILL — BACLOFEN 10 MG TABS: 10 | 20 days supply | Qty: 60 | Fill #0

## 2019-06-28 ENCOUNTER — Encounter: Payer: Self-pay | Admitting: Internal Medicine

## 2019-07-01 ENCOUNTER — Other Ambulatory Visit: Payer: 59

## 2019-07-04 ENCOUNTER — Other Ambulatory Visit: Payer: Self-pay

## 2019-07-04 ENCOUNTER — Inpatient Hospital Stay: Payer: Federal, State, Local not specified - PPO | Attending: Hematology

## 2019-07-04 DIAGNOSIS — R7989 Other specified abnormal findings of blood chemistry: Secondary | ICD-10-CM | POA: Diagnosis not present

## 2019-07-04 DIAGNOSIS — C161 Malignant neoplasm of fundus of stomach: Secondary | ICD-10-CM

## 2019-07-04 DIAGNOSIS — D649 Anemia, unspecified: Secondary | ICD-10-CM | POA: Insufficient documentation

## 2019-07-04 LAB — CBC WITH DIFFERENTIAL (CANCER CENTER ONLY)
Abs Immature Granulocytes: 0.02 10*3/uL (ref 0.00–0.07)
Basophils Absolute: 0.1 10*3/uL (ref 0.0–0.1)
Basophils Relative: 1 %
Eosinophils Absolute: 0.5 10*3/uL (ref 0.0–0.5)
Eosinophils Relative: 7 %
HCT: 41.3 % (ref 36.0–46.0)
Hemoglobin: 13.4 g/dL (ref 12.0–15.0)
Immature Granulocytes: 0 %
Lymphocytes Relative: 33 %
Lymphs Abs: 2.3 10*3/uL (ref 0.7–4.0)
MCH: 30.6 pg (ref 26.0–34.0)
MCHC: 32.4 g/dL (ref 30.0–36.0)
MCV: 94.3 fL (ref 80.0–100.0)
Monocytes Absolute: 0.3 10*3/uL (ref 0.1–1.0)
Monocytes Relative: 5 %
Neutro Abs: 3.9 10*3/uL (ref 1.7–7.7)
Neutrophils Relative %: 54 %
Platelet Count: 508 10*3/uL — ABNORMAL HIGH (ref 150–400)
RBC: 4.38 MIL/uL (ref 3.87–5.11)
RDW: 13.2 % (ref 11.5–15.5)
WBC Count: 7.1 10*3/uL (ref 4.0–10.5)
nRBC: 0 % (ref 0.0–0.2)

## 2019-07-04 LAB — CMP (CANCER CENTER ONLY)
ALT: 19 U/L (ref 0–44)
AST: 20 U/L (ref 15–41)
Albumin: 4 g/dL (ref 3.5–5.0)
Alkaline Phosphatase: 89 U/L (ref 38–126)
Anion gap: 7 (ref 5–15)
BUN: 5 mg/dL — ABNORMAL LOW (ref 6–20)
CO2: 28 mmol/L (ref 22–32)
Calcium: 9 mg/dL (ref 8.9–10.3)
Chloride: 103 mmol/L (ref 98–111)
Creatinine: 0.79 mg/dL (ref 0.44–1.00)
GFR, Est AFR Am: >60 mL/min (ref >60)
GFR, Estimated: >60 mL/min (ref >60)
Glucose, Bld: 95 mg/dL (ref 70–99)
Potassium: 4.3 mmol/L (ref 3.5–5.1)
Sodium: 138 mmol/L (ref 135–145)
Total Bilirubin: 0.3 mg/dL (ref 0.3–1.2)
Total Protein: 7.3 g/dL (ref 6.5–8.1)

## 2019-07-04 LAB — IRON AND TIBC
Iron: 98 ug/dL (ref 41–142)
Saturation Ratios: 31 % (ref 21–57)
TIBC: 313 ug/dL (ref 236–444)
UIBC: 214 ug/dL (ref 120–384)

## 2019-07-04 LAB — VITAMIN D 25 HYDROXY (VIT D DEFICIENCY, FRACTURES): Vit D, 25-Hydroxy: 15.04 ng/mL — ABNORMAL LOW (ref 30–100)

## 2019-07-04 LAB — FERRITIN: Ferritin: 47 ng/mL (ref 11–307)

## 2019-07-04 LAB — CEA (IN HOUSE-CHCC): CEA (CHCC-In House): 2.53 ng/mL (ref 0.00–5.00)

## 2019-07-05 ENCOUNTER — Telehealth: Payer: Self-pay

## 2019-07-05 ENCOUNTER — Other Ambulatory Visit: Payer: Self-pay

## 2019-07-05 DIAGNOSIS — E559 Vitamin D deficiency, unspecified: Secondary | ICD-10-CM

## 2019-07-05 NOTE — Telephone Encounter (Signed)
vm left for patient to return my call

## 2019-07-05 NOTE — Telephone Encounter (Signed)
-----   Message from Truitt Merle, MD sent at 07/04/2019 10:42 PM EDT ----- Please let pt know her lab results, anemia and iron deficiency resolved, nutrition status much improved (albumin normalized), mild thrombocytosis is probably reactive, OK to monitor. Vitd level very low, please call in VitD 50K unit weekly X8, then 2000-5000 unit daily. No other concerns.  Truitt Merle  07/04/2019

## 2019-07-06 ENCOUNTER — Other Ambulatory Visit: Payer: Self-pay

## 2019-07-06 DIAGNOSIS — E559 Vitamin D deficiency, unspecified: Secondary | ICD-10-CM

## 2019-07-06 MED ORDER — ERGOCALCIFEROL 1.25 MG (50000 UT) PO CAPS: 50000.0000 [IU] | ORAL_CAPSULE | ORAL | 0 refills | Status: AC

## 2019-07-06 MED FILL — VIT D2 1.25 MG (50,000 UNIT: 1.25 MG | 56 days supply | Qty: 8 | Fill #0

## 2019-07-06 NOTE — Telephone Encounter (Signed)
Ms Sumerlin returned my call. I reviewed her lab results per Dr. Burr Medico note.  Reviewed instructions for Vit D. She verbalized understanding

## 2019-07-11 ENCOUNTER — Ambulatory Visit: Payer: Federal, State, Local not specified - PPO | Attending: Internal Medicine

## 2019-07-11 ENCOUNTER — Ambulatory Visit: Payer: Self-pay

## 2019-07-11 DIAGNOSIS — Z23 Encounter for immunization: Secondary | ICD-10-CM

## 2019-07-11 LAB — METHYLMALONIC ACID, SERUM: Methylmalonic Acid, Quantitative: 240 nmol/L (ref 0–378)

## 2019-07-11 NOTE — Progress Notes (Signed)
   Covid-19 Vaccination Clinic  Name:  Ramira Ly    MRN: [id number] DOB: [birth year]  07/11/2019  Ms. Samons was observed post Covid-19 immunization for 15 minutes without incident. She was provided with Vaccine Information Sheet and instruction to access the V-Safe system.   Ms. Leinen was instructed to call 911 with any severe reactions post vaccine: Marland Kitchen Difficulty breathing  . Swelling of face and throat  . A fast heartbeat  . A bad rash all over body  . Dizziness and weakness   Immunizations Administered    Name Date Dose VIS Date Route   Pfizer COVID-19 Vaccine 07/11/2019 11:20 AM 0.3 mL 05/18/2018 Intramuscular   Manufacturer: [place]   Lot: [id number]   [place]: [id number]

## 2019-07-25 MED FILL — CELECOXIB 50 MG CAPS: 50 | 30 days supply | Qty: 30 | Fill #1

## 2019-07-25 MED FILL — PREGABALIN 50 MG CAPS: 50 | 30 days supply | Qty: 60 | Fill #1

## 2019-07-29 MED FILL — PARoxetine HCL 30 MG TABS: 30 | 90 days supply | Qty: 90 | Fill #1

## 2019-08-23 MED FILL — BACLOFEN 10 MG TABS: 10 | 20 days supply | Qty: 60 | Fill #1

## 2019-08-23 MED FILL — TEMAZEPAM 15 MG CAPSULE: 15 | 30 days supply | Qty: 30 | Fill #2

## 2019-08-23 MED FILL — PREGABALIN 50 MG CAPS: 50 | 30 days supply | Qty: 60 | Fill #0

## 2019-08-25 MED FILL — CELECOXIB 50 MG CAPS: 50 | 30 days supply | Qty: 30 | Fill #0

## 2019-09-12 ENCOUNTER — Other Ambulatory Visit: Payer: Self-pay | Admitting: Hematology

## 2019-09-12 DIAGNOSIS — E559 Vitamin D deficiency, unspecified: Secondary | ICD-10-CM

## 2019-09-12 MED FILL — CYANOCOBALAMIN 1,000 MCG/ML: 1000 | 90 days supply | Qty: 3 | Fill #1

## 2019-09-22 ENCOUNTER — Telehealth: Payer: Self-pay

## 2019-09-22 ENCOUNTER — Telehealth: Payer: Self-pay | Admitting: Hematology

## 2019-09-22 ENCOUNTER — Ambulatory Visit: Payer: Federal, State, Local not specified - PPO | Admitting: Physician Assistant

## 2019-09-22 NOTE — Progress Notes (Signed)
[name] OFFICE PROGRESS NOTE  Brooke Salter, MD [address] [place] [address]  DIAGNOSIS: F/u gastric cancer and iron deficiency   Oncology History Overview Note  Cancer Staging Malignant neoplasm of stomach [facility]) Staging form: Stomach, AJCC 8th Edition - Pathologic stage from 10/04/2018: Stage IA (pT1a, pN0, cM0) - Signed by Truitt Merle, MD on 10/04/2018    Malignant neoplasm of stomach ([place])  07/23/2017 Initial Biopsy   07/23/17 EGD revealed malignant gastric ulcer. 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   07/27/2017 Initial Diagnosis   Malignant neoplasm of stomach ([place])   07/28/2017 Imaging   CT CAP W Contrast 07/28/17 [facility] IMPRESSION: 1. The primary gastric lesion is not readily apparent on this study. 2. Borderline enlarged gastrohepatic ligament node measures 1 cm. Additionally there is a ovoid soft tissue attenuating nodule within the left periaortic retroperitoneum which is indeterminate. Periaortic adenopathy cannot be excluded. Consider further evaluation with PET-CT. 3. Tiny nonspecific nodule in the left upper lobe measures 4 mm.   07/30/2017 PET scan   PET 07/30/17 [facility] IMPRESSION: 1. No hypermetabolic mass or adenopathy to suggest metastatic disease. 2. Low level FDG uptake associated with the periaortic left retroperitoneal nodule compatible with a benign etiology. 3. 4 mm left upper lobe lung nodule is too small to characterize by PET-CT.   08/19/2017 Surgery   Total Gastrectomy 08/19/2017 at [facility]   08/19/2017 Pathology Results   FINAL PATHOLOGIC DIAGNOSIS from [facility] MICROSCOPIC EXAMINATION AND DIAGNOSIS  A.  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 The tumor invades into the lamina propria. Angiolymphatic invasion is not identified.  Perineural invasion is not identified.       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       Glandular mucosa with intestinal metaplasia in the distal esophagus consistent with specialized Barrett's mucosa; negative for dysplasia.       Multiple (49) perigastric lymph nodes are negative for tumor (0/49).       Squamous esophageal mucosa at the proximal surgical margin; negative for tumor.       Duodenal mucosa at the distal surgical margin; negative for tumor,       All surgical margins are negative for tumor (minimum tumor free margin, 7.0 cm, distal surgical margin).  B.  LYMPH NODES, HEPATIC ARTERY, EXCISION:      Multiple (8) hepatic artery lymph nodes are negative for tumor (0/8).  C.  LYMPH NODES, SPLENIC ARTERY, EXCISION:      Multiple (2) splenic artery lymph nodes are negative for    12/07/2017 Imaging   CT CAP W Constrast 12/07/17 [facility] IMPRESSION: 1. Status post gastrectomy, without residual or recurrent disease. 2. Left periaortic adenopathy, felt to be similar to the prior exam. Not FDG avid on interval PE T. Favored to be reactive. Recommend attention on follow-up. 3.  Aortic Atherosclerosis (ICD10-I70.0).  This is age advanced. 4. Tiny bilateral pulmonary nodules are unchanged, favoring a benign etiology.   12/10/2017 Mammogram   Diagnostic Mammogram 12/10/17 [facility] FINDINGS:  Mammographically, there is a group of punctate calcifications in the retroareolar right breast anterior third. Oval mass with circumscribed margins in the outer central aspect of the left breast middle third. On physical examination, there is no palpable abnormality in the left breast at the area of mammographic  concern. On targeted ultrasound, there is no sonographic correlate for the mammographic finding. Incidental intramammary lymph node measuring 8 x 6 x 2 mm at the 3:00 position of the left breast 4 cm from the nipple.   02/26/2018 Imaging   CT  AP W contrast 02/26/18 [facility] IMPRESSION: 1. Interval improvement or near complete resolution of the previously seen colitis involving the transverse colon. 2. Mildly thickened segment of small bowel in the left hemipelvis concerning for enteritis. Clinical correlation is recommended. No bowel obstruction. Normal appendix. 3. Large amount of stool throughout the colon. 4. A 3.5 cm left ovarian complex/hemorrhagic cyst. Ultrasound may provide better evaluation of the pelvic structures. Probable right ovarian corpus luteum.    03/02/2018 Procedure   On 03/02/18 EGD performed for dysphagia, esophageal discomfort and abdominal pain and showed mild stricture at EG junction, dilation was performed. Otherwise it was a normal EGD.  FINAL PATHOLOGIC DIAGNOSIS ESOPHAGUS, ENDOSCOPIC BIOPSY:      MILD CHRONIC INFLAMMATION.      NO EVIDENCE OF EOSINOPHILIC ESOPHAGITIS.   03/03/2018 Imaging   MRI AP 03/03/18  [facility]  IMPRESSION: Previous gastrectomy. No evidence of recurrent or metastatic disease within the abdomen or pelvis.  Interval resolution of left ovarian hemorrhagic cyst.   06/07/2018 Imaging   06/07/2018 CT Chest Abdomen and Pelvis [facility] IMPRESSION: 1. No evidence of local tumor recurrence status post distal gastrectomy. 2. No findings suspicious for metastatic disease in the chest, abdomen or pelvis. 3. Previously questioned left para-aortic adenopathy represents a benign venous variant in this patient with a circumaortic left renal vein. 4. Previously described tiny left pulmonary nodules are stable since 07/28/2017 CT and are probably benign. 5. Aortic Atherosclerosis (ICD10-I70.0).   06/11/2018 Mammogram   Diagnostic Mammogram 06/11/18  FINDINGS:  Unchanged group of punctate calcifications in the retroareolar right breast, anterior third. Unchanged oval equal density mass with circumscribed margins in the outer central aspect of the left breast, middle third. No new suspicious  mass, distortion or calcifications in either breast.   10/04/2018 Cancer Staging   Staging form: Stomach, AJCC 8th Edition - Pathologic stage from 10/04/2018: Stage IA (pT1a, pN0, cM0) - Signed by Truitt Merle, MD on 10/04/2018     CURRENT THERAPY: 1. Cancer surveillance 2. Venofer PRN, last administered on 12/30.   INTERVAL HISTORY: Brooke Austin 49 y.o. female returns to the clinic today for a follow-up visit.  The patient is feeling fair today. She has been endorsing increasingly worsening lightheadedness upon standing recently. She states sometimes when she stands up that she feels light headed and that she needs to hold on to sometime so she does not lose her balance. She states she started noticing this 3 months ago but it improved. She noticed it occurring more about 1 month ago. She is not on any blood pressure medication. She states she tries to drink 6-8 glasses of water per day. She used to be on TPN for nutrition.   The patient has a history of stage Ia gastric cancer for which she follows with Dr. Crisoforo Oxford for [facility]. Her most recently colonoscopy/enscopy was on 04/15/19. Per patient report her colonoscopy was unremarkable except possibly some evidence of colitis. She has trouble with constipation as well as reflux with eating. She has a history of needing her esophagus stretched per patient report. She also has some chronic back pain for which which she takes baclofen and Celebrex. 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  She periodically requires IV iron due to poor absorption secondary to her gastrectomy.  She denied to tolerate oral iron supplements well due to GI upset.  Her last iron infusion was on March 23, 2019.  She also receives monthly B12 injections at home. She denies any abnormal bleeding or bruising including epistaxis, gingival bleeding, hemoptysis, hematemesis, melena, hematochezia, or hematuria.  She denies any recent fever,  chills, cough, chest pain, dyspnea on exertion, syncope, ect.  She is here today for repeat blood work for her iron deficiency.   MEDICAL HISTORY: Past Medical History:  Diagnosis Date  . Cancer ([place]) 2019   Gastric Stage 1A  . Colitis     ALLERGIES:  is allergic to melatonin, tape, and zolpidem.  MEDICATIONS:  Current Outpatient Medications  Medication Sig Dispense Refill  . bisacodyl (DULCOLAX) 10 MG suppository Place 10 mg rectally daily as needed for mild constipation or moderate constipation.     . cyanocobalamin (,VITAMIN B-12,) 1000 MCG/ML injection Inject 1 mL (1,000 mcg total) into the muscle every 30 (thirty) days. 1 mL 5  . ergocalciferol (VITAMIN D2) 1.25 MG (50000 UT) capsule Take 1 capsule (50,000 Units total) by mouth once a week. 8 capsule 0  . pantoprazole (PROTONIX) 40 MG tablet Take 40 mg by mouth daily.    Marland Kitchen PARoxetine (PAXIL) 20 MG tablet Take 20 mg by mouth daily.    . polyethylene glycol (MIRALAX / GLYCOLAX) 17 g packet Take 17 g by mouth 2 (two) times a day.    . pregabalin (LYRICA) 50 MG capsule Take 50 mg by mouth 3 (three) times daily.    Marland Kitchen senna-docusate (SENOKOT-S) 8.6-50 MG tablet Take 1 tablet by mouth at bedtime as needed for mild constipation.     . Syringe/Needle, Disp, (SYRINGE 3CC/23GX1") 23G X 1" 3 ML MISC 1 Syringe by Does not apply route every 30 (thirty) days. 1 each 5  . temazepam (RESTORIL) 15 MG capsule Take 1 capsule (15 mg total) by mouth at bedtime. 30 capsule 0  . celecoxib (CELEBREX) 50 MG capsule Take 50 mg by mouth daily.    . hydrocortisone (ANUSOL-HC) 25 MG suppository Place 25 mg rectally daily as needed.    . ondansetron (ZOFRAN-ODT) 4 MG disintegrating tablet Take 4 mg by mouth every 8 (eight) hours as needed for nausea or vomiting.  (Patient not taking: Reported on 09/23/2019)     No current facility-administered medications for this visit.    SURGICAL HISTORY:  Past Surgical History:  Procedure Laterality Date  . breast  surgery    . GASTRECTOMY  2019    REVIEW OF SYSTEMS:   Review of Systems  Constitutional: Positive for fatigue. Negative for appetite change, chills,  fever and unexpected weight change.  HENT: Negative for mouth sores, nosebleeds, sore throat and trouble swallowing.   Eyes: Negative for eye problems and icterus.  Respiratory: Negative for cough, hemoptysis, shortness of breath and wheezing.   Cardiovascular: Negative for chest pain and leg swelling.  Gastrointestinal: Positive for chronic consitipation, nausea, and abdominal discomfort near her surgical scar. Negative for diarrhea and vomiting.  Genitourinary: Negative for bladder incontinence, difficulty urinating, dysuria, frequency and hematuria.   Musculoskeletal: Positive for back pain. Negative for gait problem, neck pain and neck stiffness.  Skin: Negative for itching and rash.  Neurological: positive for occasional lightheadedness. Negative for dizziness, extremity weakness, gait problem, headaches, and seizures.  Hematological: Negative for adenopathy. Does not bruise/bleed easily.  Psychiatric/Behavioral: Negative for confusion, depression and sleep  disturbance. The patient is not nervous/anxious.     PHYSICAL EXAMINATION:  Blood pressure (!) 108/92, pulse 78, temperature 97.7 F (36.5 C), temperature source Temporal, resp. rate 18, height 5\' 8"  (1.727 m), weight 130 lb 8 oz (59.2 kg), SpO2 100 %.     ECOG PERFORMANCE STATUS: 1 - Symptomatic but completely ambulatory  Physical Exam  Constitutional: Oriented to person, place, and time and well-developed, well-nourished, and in no distress.  HENT:  Head: Normocephalic and atraumatic.  Mouth/Throat: Oropharynx is clear and moist. No oropharyngeal exudate.  Eyes: Conjunctivae are normal. Right eye exhibits no discharge. Left eye exhibits no discharge. No scleral icterus.  Neck: Normal range of motion. Neck supple.  Cardiovascular: Normal rate, regular rhythm, normal heart  sounds and intact distal pulses.   Pulmonary/Chest: Effort normal and breath sounds normal. No respiratory distress. No wheezes. No rales.  Abdominal: Soft. Bowel sounds are normal. Exhibits no distension and no mass. Large abdominal surgical scar with associated discomfort to palpation. Musculoskeletal: Normal range of motion. Exhibits no edema.  Lymphadenopathy:    No cervical adenopathy.  Neurological: Alert and oriented to person, place, and time. Exhibits normal muscle tone. Gait normal. Coordination normal.  Skin: Skin is warm and dry. No rash noted. Not diaphoretic. No erythema. No pallor.  Psychiatric: Mood, memory and judgment normal.  Vitals reviewed.  LABORATORY DATA: Lab Results  Component Value Date   WBC 6.7 09/23/2019   HGB 13.1 09/23/2019   HCT 40.3 09/23/2019   MCV 94.4 09/23/2019   PLT 574 (H) 09/23/2019      Chemistry      Component Value Date/Time   NA 141 09/23/2019 1004   K 4.0 09/23/2019 1004   CL 103 09/23/2019 1004   CO2 27 09/23/2019 1004   BUN 9 09/23/2019 1004   CREATININE 0.80 09/23/2019 1004      Component Value Date/Time   CALCIUM 9.3 09/23/2019 1004   ALKPHOS 103 09/23/2019 1004   AST 19 09/23/2019 1004   ALT 22 09/23/2019 1004   BILITOT 0.5 09/23/2019 1004       RADIOGRAPHIC STUDIES:  No results found.   ASSESSMENT/PLAN:  48 yo female with   1. Iron deficiency and B12 deficiency anemia, thrombocytosis  -secondary to poor absorption from gastrectomy and subsequent intussusception reduction, and malnutrition -menorrhagia may also contribute to IDA -she had a colonoscopy in 03/2019. She denies obvious GI bleeding.  -today's labs show Hg 13.1, Ferritin 40, serum iron 135, TIBC 335, 40% transferrin saturation, and UIBC 200 -Discussed with Dr. Burr Medico, thrombocytisis is likely reactive to IDA  -she previously did not tolerate oral iron, and received IV iron after gastrectomy per patient, tolerated infusion well -Previously discussed  risks and benefits of IV Venofer including mild to severe reaction such as anaphylaxis. She agrees to proceed.  -Reviewed the patient's labs with Dr. Burr Medico, given her gastrectomy, Dr. Burr Medico has a lower threshold to treat with IV iron. Given her ferritin near 40 today, Dr. Burr Medico recommends IV iron with Venofer x4. She is scheduled to receive her first infusion later this month. She has a follow up appointment in August with Dr. Burr Medico. -she is on monthly B12 inj at home, continue   2. Lightheadedness -Worsen upon standing, likely secondary to orthostatic hypotension -BP sitting 114/87, pulse 58. BP standing 108/92 pulse 78 -Encouraged the patient to drink plenty of fluids -Advised her to monitor her BP closely at home. If she feels symptomatic, advised to check her BP. Advised to  change positions slower -If any new or worsening symptoms, encouraged the patient to follow up with her PCP  3.Maulnutrion, Nausea,vomiting, Constipation, Acid Reflux -since gastrectomy -Was on TPN for nutrition and supportive meds -constipation improving with pelvic floor PT, continue -she is reluctant to try zofran, her mother took it while she had cancer and had neuro side effect.  -she agrees to try low dose zofran ODT 4 mg PRN but will stop if she has side effects.   4. Gastric cancer, stage I, pT1aN0M0 -diagnosed 07/2017 s/p total gastrectomy due to family h/o gastric cancer. She did not require adjuvant chemo for early stage disease, complete resection, and likely low recurrence risk  -colonoscopy in 08/2018 was unremarkable except colon polyp per patient,we do not have the report. Patient is not sure where she had this done -CT AP in 10/2018 for acute abdominal pain from small bowel intussusception at j-j anastomosis s/p surgical reduction, otherwise NED -continue cancer surveillance and f/u with [place] and Dr. Crisoforo Oxford -She has ongoing pain near her surgical incision, managed on oxycodone and tramadol PRN. Patient also  has hernia in this region and her surgeon recommended follow up surgery.  -today's exam shows no concern for recurrence -Most recent colonoscopy 03/2019. Unremarkable per patient report except for reported evidence of colitis.   PLAN: -Labs reviewed -Iron deficiency anemia most likely related to gastrectomy and subsequent intestinal surgeries, malnutrition, and possibly some component of menorrhagia  -Proceed with IV venofer next week or the following week, weekly x4 - she consented  -Follow up with Dr. Burr Medico in August as scheduled.  -Advised to monitor her BP at home. Instructed to change position slower and drink fluids. If new or worsening symptoms, encouraged the patient to follow up with her PCP.     No orders of the defined types were placed in this encounter.    Ione Sandusky L Darrin Apodaca, PA-C 09/23/19

## 2019-09-22 NOTE — Telephone Encounter (Signed)
Received vm from Ms Asch stating she is dizzy frequently and is concerned her iron or vit d are low.  Reviewed with Dr. Burr Medico.  Appt for lab and f/u with Cassie made and reviewed with Ms Cu.  She verbalized understanding.

## 2019-09-22 NOTE — Telephone Encounter (Signed)
R/s due to changes in template. Pt requested to see YF. Pt is aware of appts.

## 2019-09-23 ENCOUNTER — Encounter: Payer: Self-pay | Admitting: Physician Assistant

## 2019-09-23 ENCOUNTER — Inpatient Hospital Stay: Payer: Federal, State, Local not specified - PPO | Attending: Hematology

## 2019-09-23 ENCOUNTER — Inpatient Hospital Stay: Payer: Federal, State, Local not specified - PPO | Admitting: Physician Assistant

## 2019-09-23 ENCOUNTER — Other Ambulatory Visit: Payer: Self-pay

## 2019-09-23 ENCOUNTER — Telehealth: Payer: Self-pay | Admitting: Physician Assistant

## 2019-09-23 VITALS — BP 108/92 | HR 78 | Temp 97.7°F | Resp 18 | Ht 68.0 in | Wt 130.5 lb

## 2019-09-23 DIAGNOSIS — D508 Other iron deficiency anemias: Secondary | ICD-10-CM

## 2019-09-23 DIAGNOSIS — E559 Vitamin D deficiency, unspecified: Secondary | ICD-10-CM

## 2019-09-23 DIAGNOSIS — Z85028 Personal history of other malignant neoplasm of stomach: Secondary | ICD-10-CM

## 2019-09-23 DIAGNOSIS — D519 Vitamin B12 deficiency anemia, unspecified: Secondary | ICD-10-CM | POA: Insufficient documentation

## 2019-09-23 DIAGNOSIS — D509 Iron deficiency anemia, unspecified: Secondary | ICD-10-CM | POA: Insufficient documentation

## 2019-09-23 DIAGNOSIS — C161 Malignant neoplasm of fundus of stomach: Secondary | ICD-10-CM | POA: Diagnosis not present

## 2019-09-23 LAB — CMP (CANCER CENTER ONLY)
ALT: 22 U/L (ref 0–44)
AST: 19 U/L (ref 15–41)
Albumin: 4.1 g/dL (ref 3.5–5.0)
Alkaline Phosphatase: 103 U/L (ref 38–126)
Anion gap: 11 (ref 5–15)
BUN: 9 mg/dL (ref 6–20)
CO2: 27 mmol/L (ref 22–32)
Calcium: 9.3 mg/dL (ref 8.9–10.3)
Chloride: 103 mmol/L (ref 98–111)
Creatinine: 0.8 mg/dL (ref 0.44–1.00)
GFR, Est AFR Am: >60 mL/min (ref >60)
GFR, Estimated: >60 mL/min (ref >60)
Glucose, Bld: 89 mg/dL (ref 70–99)
Potassium: 4 mmol/L (ref 3.5–5.1)
Sodium: 141 mmol/L (ref 135–145)
Total Bilirubin: 0.5 mg/dL (ref 0.3–1.2)
Total Protein: 7.4 g/dL (ref 6.5–8.1)

## 2019-09-23 LAB — SAMPLE TO BLOOD BANK

## 2019-09-23 LAB — CBC WITH DIFFERENTIAL (CANCER CENTER ONLY)
Abs Immature Granulocytes: 0.02 10*3/uL (ref 0.00–0.07)
Basophils Absolute: 0.1 10*3/uL (ref 0.0–0.1)
Basophils Relative: 1 %
Eosinophils Absolute: 0.5 10*3/uL (ref 0.0–0.5)
Eosinophils Relative: 8 %
HCT: 40.3 % (ref 36.0–46.0)
Hemoglobin: 13.1 g/dL (ref 12.0–15.0)
Immature Granulocytes: 0 %
Lymphocytes Relative: 32 %
Lymphs Abs: 2.2 10*3/uL (ref 0.7–4.0)
MCH: 30.7 pg (ref 26.0–34.0)
MCHC: 32.5 g/dL (ref 30.0–36.0)
MCV: 94.4 fL (ref 80.0–100.0)
Monocytes Absolute: 0.3 10*3/uL (ref 0.1–1.0)
Monocytes Relative: 4 %
Neutro Abs: 3.7 10*3/uL (ref 1.7–7.7)
Neutrophils Relative %: 55 %
Platelet Count: 574 10*3/uL — ABNORMAL HIGH (ref 150–400)
RBC: 4.27 MIL/uL (ref 3.87–5.11)
RDW: 13.4 % (ref 11.5–15.5)
WBC Count: 6.7 10*3/uL (ref 4.0–10.5)
nRBC: 0 % (ref 0.0–0.2)

## 2019-09-23 LAB — IRON AND TIBC
Iron: 135 ug/dL (ref 41–142)
Saturation Ratios: 40 % (ref 21–57)
TIBC: 335 ug/dL (ref 236–444)
UIBC: 200 ug/dL (ref 120–384)

## 2019-09-23 LAB — VITAMIN D 25 HYDROXY (VIT D DEFICIENCY, FRACTURES): Vit D, 25-Hydroxy: 39.45 ng/mL (ref 30–100)

## 2019-09-23 LAB — FERRITIN: Ferritin: 40 ng/mL (ref 11–307)

## 2019-09-23 LAB — CEA (IN HOUSE-CHCC): CEA (CHCC-In House): 4 ng/mL (ref 0.00–5.00)

## 2019-09-23 MED FILL — TEMAZEPAM 15 MG CAPSULE: 15 | 30 days supply | Qty: 30 | Fill #3

## 2019-09-23 MED FILL — PREGABALIN 50 MG CAPS: 50 | 30 days supply | Qty: 60 | Fill #1

## 2019-09-23 MED FILL — BACLOFEN 10 MG TABS: 10 | 20 days supply | Qty: 60 | Fill #0

## 2019-09-23 MED FILL — CELECOXIB 50 MG CAPS: 50 | 30 days supply | Qty: 30 | Fill #1

## 2019-09-23 NOTE — Telephone Encounter (Signed)
Scheduled per los. Contacted tonya at [facility] to see about scheduling her 7/9 infusion. Patient aware.

## 2019-09-29 LAB — METHYLMALONIC ACID, SERUM: Methylmalonic Acid, Quantitative: 174 nmol/L (ref 0–378)

## 2019-09-30 ENCOUNTER — Other Ambulatory Visit: Payer: Self-pay

## 2019-09-30 ENCOUNTER — Inpatient Hospital Stay: Payer: Federal, State, Local not specified - PPO

## 2019-09-30 ENCOUNTER — Telehealth: Payer: Self-pay | Admitting: Hematology

## 2019-09-30 VITALS — BP 113/64 | HR 84 | Temp 98.0°F | Resp 18

## 2019-09-30 DIAGNOSIS — D509 Iron deficiency anemia, unspecified: Secondary | ICD-10-CM | POA: Diagnosis not present

## 2019-09-30 DIAGNOSIS — D508 Other iron deficiency anemias: Secondary | ICD-10-CM

## 2019-09-30 MED ORDER — SODIUM CHLORIDE 0.9 % IV SOLN
Freq: Once | INTRAVENOUS | Status: AC
  Filled 2019-09-30: qty 250

## 2019-09-30 MED ORDER — SODIUM CHLORIDE 0.9 % IV SOLN
200.0000 mg | Freq: Once | INTRAVENOUS | Status: AC
  Administered 2019-09-30: 200 mg via INTRAVENOUS
  Filled 2019-09-30: qty 200

## 2019-09-30 NOTE — Patient Instructions (Signed)

## 2019-09-30 NOTE — Telephone Encounter (Signed)
Called and spoke with patient., confirmed all appts including one today at [facility]

## 2019-10-07 ENCOUNTER — Inpatient Hospital Stay: Payer: Federal, State, Local not specified - PPO

## 2019-10-14 ENCOUNTER — Other Ambulatory Visit: Payer: Self-pay

## 2019-10-14 ENCOUNTER — Inpatient Hospital Stay: Payer: Federal, State, Local not specified - PPO

## 2019-10-14 VITALS — BP 115/75 | HR 72 | Temp 98.7°F | Resp 18

## 2019-10-14 DIAGNOSIS — D508 Other iron deficiency anemias: Secondary | ICD-10-CM

## 2019-10-14 DIAGNOSIS — D509 Iron deficiency anemia, unspecified: Secondary | ICD-10-CM | POA: Diagnosis not present

## 2019-10-14 MED ORDER — SODIUM CHLORIDE 0.9 % IV SOLN
200.0000 mg | Freq: Once | INTRAVENOUS | Status: AC
  Administered 2019-10-14: 200 mg via INTRAVENOUS
  Filled 2019-10-14: qty 200

## 2019-10-14 MED ORDER — SODIUM CHLORIDE 0.9 % IV SOLN
INTRAVENOUS | Status: DC
  Filled 2019-10-14: qty 250

## 2019-10-14 NOTE — Patient Instructions (Signed)

## 2019-10-20 ENCOUNTER — Telehealth: Payer: Self-pay | Admitting: Hematology

## 2019-10-20 NOTE — Telephone Encounter (Signed)
Called per 7/29 sch msg - unable to reach pt. Left message for patient to call back if reschedule is still needed

## 2019-10-21 ENCOUNTER — Inpatient Hospital Stay: Payer: Federal, State, Local not specified - PPO

## 2019-10-21 MED FILL — TEMAZEPAM 15 MG CAPSULE: 15 | 30 days supply | Qty: 30 | Fill #0

## 2019-10-31 ENCOUNTER — Other Ambulatory Visit (HOSPITAL_COMMUNITY): Payer: Self-pay | Admitting: Internal Medicine

## 2019-10-31 MED FILL — PREGABALIN 50 MG CAPS: 50 | 30 days supply | Qty: 90 | Fill #0

## 2019-10-31 MED FILL — CELECOXIB 50 MG CAPS: 50 | 30 days supply | Qty: 30 | Fill #0

## 2019-10-31 MED FILL — PARoxetine HCL 30 MG TABS: 30 | 90 days supply | Qty: 90 | Fill #0

## 2019-10-31 MED FILL — BACLOFEN 10 MG TABS: 10 | 20 days supply | Qty: 60 | Fill #0

## 2019-11-04 ENCOUNTER — Inpatient Hospital Stay: Payer: Federal, State, Local not specified - PPO | Admitting: Hematology

## 2019-11-04 ENCOUNTER — Ambulatory Visit: Payer: 59 | Admitting: Hematology

## 2019-11-04 ENCOUNTER — Other Ambulatory Visit: Payer: 59

## 2019-11-04 ENCOUNTER — Inpatient Hospital Stay: Payer: Federal, State, Local not specified - PPO

## 2019-11-21 ENCOUNTER — Inpatient Hospital Stay: Payer: Federal, State, Local not specified - PPO | Admitting: Hematology

## 2019-11-21 ENCOUNTER — Inpatient Hospital Stay: Payer: Federal, State, Local not specified - PPO

## 2019-11-21 MED FILL — TEMAZEPAM 15 MG CAPSULE: 15 | 30 days supply | Qty: 30 | Fill #1

## 2019-12-14 NOTE — Progress Notes (Signed)
Brooke Austin   [phone] [phone]   Clinic Follow up Note   Patient Care Team: Haydee Salter, MD as PCP - General (Internal Medicine)  Date of Service:  12/16/2019  CHIEF COMPLAINT:  F/u gastric cancer and iron deficiency  SUMMARY OF ONCOLOGIC HISTORY: Oncology History Overview Note  Cancer Staging Malignant neoplasm of stomach [facility]) Staging form: Stomach, AJCC 8th Edition - Pathologic stage from 10/04/2018: Stage IA (pT1a, pN0, cM0) - Signed by Truitt Merle, MD on 10/04/2018    Malignant neoplasm of stomach ([place])  07/23/2017 Initial Biopsy   07/23/17 EGD revealed malignant gastric ulcer. 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   07/27/2017 Initial Diagnosis   Malignant neoplasm of stomach ([place])   07/28/2017 Imaging   CT CAP W Contrast 07/28/17 [facility] IMPRESSION: 1. The primary gastric lesion is not readily apparent on this study. 2. Borderline enlarged gastrohepatic ligament node measures 1 cm. Additionally there is a ovoid soft tissue attenuating nodule within the left periaortic retroperitoneum which is indeterminate. Periaortic adenopathy cannot be excluded. Consider further evaluation with PET-CT. 3. Tiny nonspecific nodule in the left upper lobe measures 4 mm.   07/30/2017 PET scan   PET 07/30/17 [facility] IMPRESSION: 1. No hypermetabolic mass or adenopathy to suggest metastatic disease. 2. Low level FDG uptake associated with the periaortic left retroperitoneal nodule compatible with a benign etiology. 3. 4 mm left upper lobe lung nodule is too small to characterize by PET-CT.   08/19/2017 Surgery   Total Gastrectomy 08/19/2017 at [facility]   08/19/2017 Pathology Results   FINAL PATHOLOGIC DIAGNOSIS from [facility] MICROSCOPIC EXAMINATION AND DIAGNOSIS  A.  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 The tumor invades into the lamina propria. Angiolymphatic invasion is not identified. Perineural invasion is not identified.       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       Glandular mucosa with intestinal metaplasia in the distal esophagus consistent with specialized Barrett's mucosa; negative for dysplasia.       Multiple (49) perigastric lymph nodes are negative for tumor (0/49).       Squamous esophageal mucosa at the proximal surgical margin; negative for tumor.       Duodenal mucosa at the distal surgical margin; negative for tumor,       All surgical margins are negative for tumor (minimum tumor free margin, 7.0 cm, distal surgical margin).  B.  LYMPH NODES, HEPATIC ARTERY, EXCISION:      Multiple (8) hepatic artery lymph nodes are negative for tumor (0/8).  C.  LYMPH NODES, SPLENIC ARTERY, EXCISION:      Multiple (2) splenic artery lymph nodes are negative for    12/07/2017 Imaging   CT CAP W Constrast 12/07/17 [facility] IMPRESSION: 1. Status post gastrectomy, without residual or recurrent disease. 2. Left periaortic adenopathy, felt to be similar to the prior exam. Not FDG avid on interval PE T. Favored to be reactive. Recommend attention on follow-up. 3.  Aortic Atherosclerosis (ICD10-I70.0).  This is age advanced. 4. Tiny bilateral pulmonary nodules are unchanged, favoring a benign etiology.   12/10/2017 Mammogram   Diagnostic Mammogram 12/10/17 [facility] FINDINGS:  Mammographically, there is a group of punctate calcifications in the retroareolar right breast anterior third. Oval mass with circumscribed margins in the outer central aspect of  the left breast middle third. On physical examination, there is no palpable abnormality in the left breast at the area of mammographic concern. On targeted ultrasound, there is no sonographic correlate for the mammographic finding. Incidental intramammary  lymph node measuring 8 x 6 x 2 mm at the 3:00 position of the left breast 4 cm from the nipple.   02/26/2018 Imaging   CT AP W contrast 02/26/18 [facility] IMPRESSION: 1. Interval improvement or near complete resolution of the previously seen colitis involving the transverse colon. 2. Mildly thickened segment of small bowel in the left hemipelvis concerning for enteritis. Clinical correlation is recommended. No bowel obstruction. Normal appendix. 3. Large amount of stool throughout the colon. 4. A 3.5 cm left ovarian complex/hemorrhagic cyst. Ultrasound may provide better evaluation of the pelvic structures. Probable right ovarian corpus luteum.    03/02/2018 Procedure   On 03/02/18 EGD performed for dysphagia, esophageal discomfort and abdominal pain and showed mild stricture at EG junction, dilation was performed. Otherwise it was a normal EGD.  FINAL PATHOLOGIC DIAGNOSIS ESOPHAGUS, ENDOSCOPIC BIOPSY:      MILD CHRONIC INFLAMMATION.      NO EVIDENCE OF EOSINOPHILIC ESOPHAGITIS.   03/03/2018 Imaging   MRI AP 03/03/18  [facility]  IMPRESSION: Previous gastrectomy. No evidence of recurrent or metastatic disease within the abdomen or pelvis.  Interval resolution of left ovarian hemorrhagic cyst.   06/07/2018 Imaging   06/07/2018 CT Chest Abdomen and Pelvis [facility] IMPRESSION: 1. No evidence of local tumor recurrence status post distal gastrectomy. 2. No findings suspicious for metastatic disease in the chest, abdomen or pelvis. 3. Previously questioned left para-aortic adenopathy represents a benign venous variant in this patient with a circumaortic left renal vein. 4. Previously described tiny left pulmonary nodules are stable since 07/28/2017 CT and are probably benign. 5. Aortic Atherosclerosis (ICD10-I70.0).   06/11/2018 Mammogram   Diagnostic Mammogram 06/11/18  FINDINGS:  Unchanged group of punctate calcifications in the retroareolar right breast, anterior third. Unchanged oval equal  density mass with circumscribed margins in the outer central aspect of the left breast, middle third. No new suspicious mass, distortion or calcifications in either breast.   10/04/2018 Cancer Staging   Staging form: Stomach, AJCC 8th Edition - Pathologic stage from 10/04/2018: Stage IA (pT1a, pN0, cM0) - Signed by Truitt Merle, MD on 10/04/2018      CURRENT THERAPY:  1. Cancer surveillance 2. IV Venofer as needed in 02/2019 and 09/2019 3. Monthly B12 injections at home  INTERVAL HISTORY:  Brooke Austin is here for a follow up of gastric cancer. She was last seen by me in 01/2019 and seen by APPs in interim, last in 09/2019. Her husband was called to be included in the visit. She presents to the clinic alone. She notes she is doing well. She notes she has gained weight and now stable overall. She has been off TPN since 04/2019 after being on for 4 months. She notes she was seen by Dr Juleen China for hemorrhoids but she had colitis. She may also have IBS. This has caused her constipation. She also has back pain and plans to have back surgery on 01/07/20 for her severe stenosis and cyst. She notes she ran out of B12 injections 2 months ago.     REVIEW OF SYSTEMS:   Constitutional: Denies fevers, chills or abnormal weight loss Eyes: Denies blurriness of vision Ears, nose, mouth, throat, and face: Denies mucositis or sore throat Respiratory: Denies cough, dyspnea or wheezes Cardiovascular: Denies palpitation, chest discomfort  or lower extremity swelling Gastrointestinal:  Denies nausea, heartburn (+) Constipation/colitis/IBS Skin: Denies abnormal skin rashes MSK: (+) Back pain  Lymphatics: Denies new lymphadenopathy or easy bruising Neurological:Denies numbness, tingling or new weaknesses Behavioral/Psych: Mood is stable, no new changes  All other systems were reviewed with the patient and are negative.  MEDICAL HISTORY:  Past Medical History:  Diagnosis Date  . Cancer ([place]) 2019   Gastric  Stage 1A  . Colitis     SURGICAL HISTORY: Past Surgical History:  Procedure Laterality Date  . breast surgery    . GASTRECTOMY  2019    I have reviewed the social history and family history with the patient and they are unchanged from previous note.  ALLERGIES:  is allergic to melatonin, tape, and zolpidem.  MEDICATIONS:  Current Outpatient Medications  Medication Sig Dispense Refill  . baclofen (LIORESAL) 10 MG tablet Take 10 mg by mouth 3 (three) times daily as needed.    . bisacodyl (DULCOLAX) 10 MG suppository Place 10 mg rectally daily as needed for mild constipation or moderate constipation.     . celecoxib (CELEBREX) 50 MG capsule Take 50 mg by mouth daily.    . cyanocobalamin (,VITAMIN B-12,) 1000 MCG/ML injection Inject 1 mL (1,000 mcg total) into the muscle every 30 (thirty) days. 1 mL 5  . ergocalciferol (VITAMIN D2) 1.25 MG (50000 UT) capsule Take 1 capsule (50,000 Units total) by mouth once a week. 8 capsule 0  . hydrocortisone (ANUSOL-HC) 25 MG suppository Place 25 mg rectally daily as needed.    . ondansetron (ZOFRAN-ODT) 4 MG disintegrating tablet Take 4 mg by mouth every 8 (eight) hours as needed for nausea or vomiting.  (Patient not taking: Reported on 09/23/2019)    . pantoprazole (PROTONIX) 40 MG tablet Take 40 mg by mouth daily.    Marland Kitchen PARoxetine (PAXIL) 20 MG tablet Take 20 mg by mouth daily.    . polyethylene glycol (MIRALAX / GLYCOLAX) 17 g packet Take 17 g by mouth 2 (two) times a day.    . pregabalin (LYRICA) 50 MG capsule Take 50 mg by mouth 3 (three) times daily.    . Syringe/Needle, Disp, (SYRINGE 3CC/23GX1") 23G X 1" 3 ML MISC 1 Syringe by Does not apply route every 30 (thirty) days. 1 each 5  . temazepam (RESTORIL) 15 MG capsule Take 1 capsule (15 mg total) by mouth at bedtime. 30 capsule 0   No current facility-administered medications for this visit.    PHYSICAL EXAMINATION: ECOG PERFORMANCE STATUS: 1 - Symptomatic but completely ambulatory  Vitals:     12/16/19 1021  BP: 101/71  Pulse: 80  Resp: 18  Temp: 98 F (36.7 C)  SpO2: 100%   Filed Weights   12/16/19 1021  Weight: 127 lb 12.8 oz (58 kg)    GENERAL:alert, no distress and comfortable SKIN: skin color, texture, turgor are normal, no rashes or significant lesions EYES: normal, Conjunctiva are pink and non-injected, sclera clear  NECK: supple, thyroid normal size, non-tender, without nodularity LYMPH:  no palpable lymphadenopathy in the cervical, axillary  LUNGS: clear to auscultation and percussion with normal breathing effort HEART: regular rate & rhythm and no murmurs and no lower extremity edema ABDOMEN:abdomen soft, non-tender and normal bowel sounds (+) Midline surgical incision healed well with very small hernia  Musculoskeletal:no cyanosis of digits and no clubbing  NEURO: alert & oriented x 3 with fluent speech, no focal motor/sensory deficits  LABORATORY DATA:  I have reviewed the data as listed  CBC Latest Ref Rng & Units 12/16/2019 09/23/2019 07/04/2019  WBC 4.0 - 10.5 K/uL 6.8 6.7 7.1  Hemoglobin 12.0 - 15.0 g/dL 13.5 13.1 13.4  Hematocrit 36 - 46 % 40.8 40.3 41.3  Platelets 150 - 400 K/uL 585(H) 574(H) 508(H)     CMP Latest Ref Rng & Units 12/16/2019 09/23/2019 07/04/2019  Glucose 70 - 99 mg/dL 49(L) 89 95  BUN 6 - 20 mg/dL 10 9 5(L)  Creatinine 0.44 - 1.00 mg/dL 0.79 0.80 0.79  Sodium 135 - 145 mmol/L 142 141 138  Potassium 3.5 - 5.1 mmol/L 3.9 4.0 4.3  Chloride 98 - 111 mmol/L 109 103 103  CO2 22 - 32 mmol/L 28 27 28   Calcium 8.9 - 10.3 mg/dL 9.3 9.3 9.0  Total Protein 6.5 - 8.1 g/dL 7.1 7.4 7.3  Total Bilirubin 0.3 - 1.2 mg/dL 0.5 0.5 0.3  Alkaline Phos 38 - 126 U/L 98 103 89  AST 15 - 41 U/L 16 19 20   ALT 0 - 44 U/L 20 22 19       RADIOGRAPHIC STUDIES: I have personally reviewed the radiological images as listed and agreed with the findings in the report. No results found.   ASSESSMENT & PLAN:  Brooke Austin is a 49 y.o. female with    1.  Iron deficiency and B12 deficiency anemia, thrombocytosis  -Secondary to poor absorption from gastrectomy and subsequent intussusception reduction. 09/2019 labs showed Hg 13.1, Ferritin 40, serum iron 135, TIBC 335, 40% transferrin saturation, and UIBC 200 -Menorrhagia may also contribute to IDA. She had a colonoscopy in 03/2019. She denies obvious GI bleeding unless she strains with constipation.  -Her thrombocytosis is likely reactive to IDA  -She previously did not tolerate oral iron, and received IV Venofer after gastrectomy in 02/2019. Restarted in 09/2019 as needed.  -Due to B12 deficiency since gastrectomy, she will continue monthly B12 injections at home.  -Labs today show no anemia, plt 585K. Iron panel is still pending.  -Continue monthly B12 injections and IV Venofer as needed if ferritin<30 -Repeat labs every 2 months locally. F/u in 6 months    2. Gastric Cancer, Stage I, pT1aN0M0 -Diagnosed in 07/2017. S/p total gastrectomydue to her strong family history of gastric cancer.  -Her cancer was very early stageand,s/p complete resection and her risk for recurrence is low.  -Most recent colonoscopy 03/2019. Unremarkable per patient report except for reported evidence of colitis.  -She has very small hernia at midline incision. Will monitor.  -She is currently on surveillance. Today's CEA still pending. Plan to repeat Endoscopy in early 2022.   3.Weight Management  -After gastrectomy she became malnourished with severe weight loss.  -After 4 month of TPN completed in 04/2019 she has gained adequate weight from 85 pounds to 120-130 range now. Stable.   4. Constipation -Colonoscopy and Exam with Dr Juleen China on 04/15/19 showed colitis and possible IBS as the cause of her constipation. Patient also notes dumping syndrome and mild rectal bleeding with hard constipation.  -I also recommend Miralax to help her constipation.   5. Chronic sciatica low back pain  -She sees orthopedicsurgeon,  Dr. Junius Roads -Given severe spinal stenosis and back pain, she plans to have back surgery in 12/2019.   6. Insomnia, Depression/Anxiety -Has been on Paxil -Mood stable.   7. Right breast calcifications and left breast mass -As seen on her 12/10/17 Mammogram and stable on 06/11/18 mammogram -I encourage her to have mammogram once a year, she is overdue   8.  Genetic testing was negative for pathogenetic mutations.    PLAN: -Flu shot today  -I refilled B12 injections, continue monthly.  -I gave lab prescription to be done at local [place] in 2 and 4 months locally  -she will contact her GI Dr. Rogene Houston at [facility] for EGD in early 2022 -she will call to schedule her annual mammogram  -Lab and F/u in 6 months    No problem-specific Assessment & Plan notes found for this encounter.   No orders of the defined types were placed in this encounter.  All questions were answered. The patient knows to call the clinic with any problems, questions or concerns. No barriers to learning was detected. The total time spent in the appointment was 30 minutes.     Truitt Merle, MD 12/16/2019   I, Joslyn Devon, am acting as scribe for Truitt Merle, MD.   I have reviewed the above documentation for accuracy and completeness, and I agree with the above.

## 2019-12-16 ENCOUNTER — Inpatient Hospital Stay: Payer: Federal, State, Local not specified - PPO | Admitting: Hematology

## 2019-12-16 ENCOUNTER — Inpatient Hospital Stay: Payer: Federal, State, Local not specified - PPO | Attending: Hematology

## 2019-12-16 ENCOUNTER — Encounter: Payer: Self-pay | Admitting: Hematology

## 2019-12-16 ENCOUNTER — Other Ambulatory Visit: Payer: Self-pay

## 2019-12-16 VITALS — BP 101/71 | HR 80 | Temp 98.0°F | Resp 18 | Ht 68.0 in | Wt 127.8 lb

## 2019-12-16 DIAGNOSIS — E538 Deficiency of other specified B group vitamins: Secondary | ICD-10-CM | POA: Insufficient documentation

## 2019-12-16 DIAGNOSIS — C161 Malignant neoplasm of fundus of stomach: Secondary | ICD-10-CM

## 2019-12-16 DIAGNOSIS — D508 Other iron deficiency anemias: Secondary | ICD-10-CM

## 2019-12-16 DIAGNOSIS — C169 Malignant neoplasm of stomach, unspecified: Secondary | ICD-10-CM | POA: Diagnosis not present

## 2019-12-16 DIAGNOSIS — K5900 Constipation, unspecified: Secondary | ICD-10-CM | POA: Diagnosis not present

## 2019-12-16 DIAGNOSIS — Z23 Encounter for immunization: Secondary | ICD-10-CM | POA: Diagnosis not present

## 2019-12-16 DIAGNOSIS — Z85028 Personal history of other malignant neoplasm of stomach: Secondary | ICD-10-CM

## 2019-12-16 LAB — CMP (CANCER CENTER ONLY)
ALT: 20 U/L (ref 0–44)
AST: 16 U/L (ref 15–41)
Albumin: 4 g/dL (ref 3.5–5.0)
Alkaline Phosphatase: 98 U/L (ref 38–126)
Anion gap: 5 (ref 5–15)
BUN: 10 mg/dL (ref 6–20)
CO2: 28 mmol/L (ref 22–32)
Calcium: 9.3 mg/dL (ref 8.9–10.3)
Chloride: 109 mmol/L (ref 98–111)
Creatinine: 0.79 mg/dL (ref 0.44–1.00)
GFR, Est AFR Am: >60 mL/min (ref >60)
GFR, Estimated: >60 mL/min (ref >60)
Glucose, Bld: 49 mg/dL — ABNORMAL LOW (ref 70–99)
Potassium: 3.9 mmol/L (ref 3.5–5.1)
Sodium: 142 mmol/L (ref 135–145)
Total Bilirubin: 0.5 mg/dL (ref 0.3–1.2)
Total Protein: 7.1 g/dL (ref 6.5–8.1)

## 2019-12-16 LAB — CBC WITH DIFFERENTIAL (CANCER CENTER ONLY)
Abs Immature Granulocytes: 0.01 10*3/uL (ref 0.00–0.07)
Basophils Absolute: 0.1 10*3/uL (ref 0.0–0.1)
Basophils Relative: 1 %
Eosinophils Absolute: 0.6 10*3/uL — ABNORMAL HIGH (ref 0.0–0.5)
Eosinophils Relative: 10 %
HCT: 40.8 % (ref 36.0–46.0)
Hemoglobin: 13.5 g/dL (ref 12.0–15.0)
Immature Granulocytes: 0 %
Lymphocytes Relative: 25 %
Lymphs Abs: 1.7 10*3/uL (ref 0.7–4.0)
MCH: 30.8 pg (ref 26.0–34.0)
MCHC: 33.1 g/dL (ref 30.0–36.0)
MCV: 92.9 fL (ref 80.0–100.0)
Monocytes Absolute: 0.4 10*3/uL (ref 0.1–1.0)
Monocytes Relative: 7 %
Neutro Abs: 3.9 10*3/uL (ref 1.7–7.7)
Neutrophils Relative %: 57 %
Platelet Count: 585 10*3/uL — ABNORMAL HIGH (ref 150–400)
RBC: 4.39 MIL/uL (ref 3.87–5.11)
RDW: 13.2 % (ref 11.5–15.5)
WBC Count: 6.8 10*3/uL (ref 4.0–10.5)
nRBC: 0 % (ref 0.0–0.2)

## 2019-12-16 LAB — IRON AND TIBC
Iron: 100 ug/dL (ref 41–142)
Saturation Ratios: 35 % (ref 21–57)
TIBC: 286 ug/dL (ref 236–444)
UIBC: 187 ug/dL (ref 120–384)

## 2019-12-16 LAB — FERRITIN: Ferritin: 92 ng/mL (ref 11–307)

## 2019-12-16 LAB — CEA (IN HOUSE-CHCC): CEA (CHCC-In House): 4.91 ng/mL (ref 0.00–5.00)

## 2019-12-16 MED ORDER — INFLUENZA VAC SPLIT QUAD 0.5 ML IM SUSY
PREFILLED_SYRINGE | INTRAMUSCULAR | Status: AC
  Filled 2019-12-16: qty 0.5

## 2019-12-16 MED ORDER — INFLUENZA VAC SPLIT QUAD 0.5 ML IM SUSY
0.5000 mL | PREFILLED_SYRINGE | Freq: Once | INTRAMUSCULAR | Status: AC
  Administered 2019-12-16: 0.5 mL via INTRAMUSCULAR

## 2019-12-16 MED ORDER — CYANOCOBALAMIN 1000 MCG/ML IJ SOLN: 1000.0000 ug | INTRAMUSCULAR | 5 refills | Status: DC

## 2019-12-16 MED FILL — CYANOCOBALAMIN 1,000 MCG/ML: 1000 | 30 days supply | Qty: 1 | Fill #0

## 2019-12-19 ENCOUNTER — Telehealth: Payer: Self-pay | Admitting: Hematology

## 2019-12-19 NOTE — Telephone Encounter (Signed)
Scheduled per 9/24 los. Pt is aware of appt times and date. 

## 2019-12-20 ENCOUNTER — Encounter: Payer: Self-pay | Admitting: Hematology

## 2019-12-23 LAB — METHYLMALONIC ACID, SERUM: Methylmalonic Acid, Quantitative: 207 nmol/L (ref 0–378)

## 2019-12-27 ENCOUNTER — Other Ambulatory Visit: Payer: Self-pay | Admitting: Nurse Practitioner

## 2019-12-27 MED FILL — PREGABALIN 50 MG CAPS: 50 | 30 days supply | Qty: 90 | Fill #1

## 2019-12-27 MED FILL — TEMAZEPAM 15 MG CAPSULE: 15 | 30 days supply | Qty: 30 | Fill #2

## 2019-12-27 MED FILL — BACLOFEN 10 MG TABS: 10 | 20 days supply | Qty: 60 | Fill #1

## 2019-12-27 NOTE — Telephone Encounter (Signed)
This has not been filled since 02/2019.

## 2019-12-28 ENCOUNTER — Other Ambulatory Visit (HOSPITAL_COMMUNITY): Payer: Self-pay | Admitting: Physician Assistant

## 2019-12-28 MED FILL — HYDROCODON-APAP 5-325: 5-325 | 5 days supply | Qty: 10 | Fill #0

## 2020-01-11 MED FILL — BACLOFEN 10 MG TABS: 10 | 20 days supply | Qty: 60 | Fill #1

## 2020-01-20 MED FILL — PARoxetine HCL 30 MG TABS: 30 | 90 days supply | Qty: 90 | Fill #1

## 2020-01-23 MED FILL — TEMAZEPAM 15 MG CAPSULE: 15 | 30 days supply | Qty: 30 | Fill #3

## 2020-01-24 MED FILL — PREGABALIN 50 MG CAPS: 50 | 30 days supply | Qty: 90 | Fill #2

## 2020-02-20 MED FILL — TEMAZEPAM 15 MG CAPSULE: 15 | 30 days supply | Qty: 30 | Fill #0

## 2020-02-22 ENCOUNTER — Other Ambulatory Visit (HOSPITAL_COMMUNITY): Payer: Self-pay | Admitting: Internal Medicine

## 2020-03-22 MED FILL — TEMAZEPAM 15 MG CAPSULE: 15 | 30 days supply | Qty: 30 | Fill #1

## 2020-04-13 ENCOUNTER — Encounter: Payer: Self-pay | Admitting: General Practice

## 2020-04-13 NOTE — Progress Notes (Signed)
[place] CSW Progress Notes  Call from patient, she is tearful, anxious.  Concerned about relationship with husband, states they have frequent arguments which are distressing to her.  She has no other sources of support - was diagnosed w cancer around the time of the marriage.  Has had multiple surgeries, treatments, medical issues.  She is not suicidal, but does feel like she needs help with anxiety and stress management.  She will contact her PCP for a local referral to a therapist.  [place] also emailed her newsletters from [facility] and encouraged her to connect with any options she finds helpful.  Edwyna Shell, LCSW Clinical Social Worker Phone:  [phone]

## 2020-04-21 MED FILL — TEMAZEPAM 15 MG CAPSULE: 15 | 30 days supply | Qty: 30 | Fill #2

## 2020-04-27 ENCOUNTER — Telehealth: Payer: Self-pay | Admitting: Hematology

## 2020-04-27 NOTE — Telephone Encounter (Signed)
R/s appt per 2/4 sch msg - pt is aware of appt date and time

## 2020-05-02 NOTE — Progress Notes (Signed)
[place]   [phone] [phone]   Clinic Follow up Note   Patient Care Team: Haydee Salter, MD as PCP - General (Internal Medicine)  Date of Service:  05/04/2020  CHIEF COMPLAINT: F/u gastric cancer and iron deficiency  SUMMARY OF ONCOLOGIC HISTORY: Oncology History Overview Note  Cancer Staging Malignant neoplasm of stomach [facility]) Staging form: Stomach, AJCC 8th Edition - Pathologic stage from 10/04/2018: Stage IA (pT1a, pN0, cM0) - Signed by Truitt Merle, MD on 10/04/2018    Malignant neoplasm of stomach ([place])  07/23/2017 Initial Biopsy   07/23/17 EGD revealed malignant gastric ulcer. 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   07/27/2017 Initial Diagnosis   Malignant neoplasm of stomach ([place])   07/28/2017 Imaging   CT CAP W Contrast 07/28/17 [facility] IMPRESSION: 1. The primary gastric lesion is not readily apparent on this study. 2. Borderline enlarged gastrohepatic ligament node measures 1 cm. Additionally there is a ovoid soft tissue attenuating nodule within the left periaortic retroperitoneum which is indeterminate. Periaortic adenopathy cannot be excluded. Consider further evaluation with PET-CT. 3. Tiny nonspecific nodule in the left upper lobe measures 4 mm.   07/30/2017 PET scan   PET 07/30/17 [facility] IMPRESSION: 1. No hypermetabolic mass or adenopathy to suggest metastatic disease. 2. Low level FDG uptake associated with the periaortic left retroperitoneal nodule compatible with a benign etiology. 3. 4 mm left upper lobe lung nodule is too small to characterize by PET-CT.   08/19/2017 Surgery   Total Gastrectomy 08/19/2017 at [facility]   08/19/2017 Pathology Results   FINAL PATHOLOGIC DIAGNOSIS from [facility] MICROSCOPIC EXAMINATION AND DIAGNOSIS  A.  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 The tumor invades into the lamina propria. Angiolymphatic invasion is not identified. Perineural invasion is not identified.       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       Glandular mucosa with intestinal metaplasia in the distal esophagus consistent with specialized Barrett's mucosa; negative for dysplasia.       Multiple (49) perigastric lymph nodes are negative for tumor (0/49).       Squamous esophageal mucosa at the proximal surgical margin; negative for tumor.       Duodenal mucosa at the distal surgical margin; negative for tumor,       All surgical margins are negative for tumor (minimum tumor free margin, 7.0 cm, distal surgical margin).  B.  LYMPH NODES, HEPATIC ARTERY, EXCISION:      Multiple (8) hepatic artery lymph nodes are negative for tumor (0/8).  C.  LYMPH NODES, SPLENIC ARTERY, EXCISION:      Multiple (2) splenic artery lymph nodes are negative for    12/07/2017 Imaging   CT CAP W Constrast 12/07/17 [facility] IMPRESSION: 1. Status post gastrectomy, without residual or recurrent disease. 2. Left periaortic adenopathy, felt to be similar to the prior exam. Not FDG avid on interval PE T. Favored to be reactive. Recommend attention on follow-up. 3.  Aortic Atherosclerosis (ICD10-I70.0).  This is age advanced. 4. Tiny bilateral pulmonary nodules are unchanged, favoring a benign etiology.   12/10/2017 Mammogram   Diagnostic Mammogram 12/10/17 [facility] FINDINGS:  Mammographically, there is a group of punctate calcifications in the retroareolar right breast anterior third. Oval mass with circumscribed margins in the outer central aspect of the  left breast middle third. On physical examination, there is no palpable abnormality in the left breast at the area of mammographic concern. On targeted ultrasound, there is no sonographic correlate for the mammographic finding. Incidental intramammary  lymph node measuring 8 x 6 x 2 mm at the 3:00 position of the left breast 4 cm from the nipple.   02/26/2018 Imaging   CT AP W contrast 02/26/18 [facility] IMPRESSION: 1. Interval improvement or near complete resolution of the previously seen colitis involving the transverse colon. 2. Mildly thickened segment of small bowel in the left hemipelvis concerning for enteritis. Clinical correlation is recommended. No bowel obstruction. Normal appendix. 3. Large amount of stool throughout the colon. 4. A 3.5 cm left ovarian complex/hemorrhagic cyst. Ultrasound may provide better evaluation of the pelvic structures. Probable right ovarian corpus luteum.    03/02/2018 Procedure   On 03/02/18 EGD performed for dysphagia, esophageal discomfort and abdominal pain and showed mild stricture at EG junction, dilation was performed. Otherwise it was a normal EGD.  FINAL PATHOLOGIC DIAGNOSIS ESOPHAGUS, ENDOSCOPIC BIOPSY:      MILD CHRONIC INFLAMMATION.      NO EVIDENCE OF EOSINOPHILIC ESOPHAGITIS.   03/03/2018 Imaging   MRI AP 03/03/18  [facility]  IMPRESSION: Previous gastrectomy. No evidence of recurrent or metastatic disease within the abdomen or pelvis.  Interval resolution of left ovarian hemorrhagic cyst.   06/07/2018 Imaging   06/07/2018 CT Chest Abdomen and Pelvis [facility] IMPRESSION: 1. No evidence of local tumor recurrence status post distal gastrectomy. 2. No findings suspicious for metastatic disease in the chest, abdomen or pelvis. 3. Previously questioned left para-aortic adenopathy represents a benign venous variant in this patient with a circumaortic left renal vein. 4. Previously described tiny left pulmonary nodules are stable since 07/28/2017 CT and are probably benign. 5. Aortic Atherosclerosis (ICD10-I70.0).   06/11/2018 Mammogram   Diagnostic Mammogram 06/11/18  FINDINGS:  Unchanged group of punctate calcifications in the retroareolar right breast, anterior third. Unchanged oval equal  density mass with circumscribed margins in the outer central aspect of the left breast, middle third. No new suspicious mass, distortion or calcifications in either breast.   10/04/2018 Cancer Staging   Staging form: Stomach, AJCC 8th Edition - Pathologic stage from 10/04/2018: Stage IA (pT1a, pN0, cM0) - Signed by Truitt Merle, MD on 10/04/2018      CURRENT THERAPY:  1. Cancer surveillance 2. IV Venofer as needed, received in 02/2019 and 09/2019 3. Monthly B12 injections at home  INTERVAL HISTORY:  Brooke Austin is here for a follow up. She was last seen by me 5 months ago. She presents to the clinic alone. She notes she is having good and fair days. She notes she had back surgery on 01/04/20. She notes she is getting better but still has a lot of low back pain. It is hard to say if the surgery helped her pain. She continues PT and can walk adequately. She notes with last colonoscopy in 03/2019 she had H. Pylorie and was started on flagyl. The abdominal pain resolved on Flagyl. She notes her diarrhea is better as well. She notes her eating is fair. She was able to gain weight after her back surgery.  She notes she may have missed her B12 dose, but she has felt fatigued and tired. She notes her last injection was 4 days ago. She wonders does she need B12 injection more often. She notes her period is irregular but will last 5 days. She notes these periods are heavy.  REVIEW OF SYSTEMS:   Constitutional: Denies fevers, chills or abnormal weight loss Eyes: Denies blurriness of vision Ears, nose, mouth, throat, and face: Denies mucositis or sore throat Respiratory: Denies cough, dyspnea or wheezes Cardiovascular: Denies palpitation, chest discomfort or lower extremity swelling Gastrointestinal:  Denies nausea, heartburn or change in bowel habits Skin: Denies abnormal skin rashes (+) Lower Back pain  Lymphatics: Denies new lymphadenopathy or easy bruising Neurological:Denies numbness, tingling or  new weaknesses Behavioral/Psych: Mood is stable, no new changes  All other systems were reviewed with the patient and are negative.  MEDICAL HISTORY:  Past Medical History:  Diagnosis Date  . Cancer ([place]) 2019   Gastric Stage 1A  . Colitis     SURGICAL HISTORY: Past Surgical History:  Procedure Laterality Date  . breast surgery    . GASTRECTOMY  2019    I have reviewed the social history and family history with the patient and they are unchanged from previous note.  ALLERGIES:  is allergic to melatonin, tape, and zolpidem.  MEDICATIONS:  Current Outpatient Medications  Medication Sig Dispense Refill  . baclofen (LIORESAL) 10 MG tablet Take 10 mg by mouth 3 (three) times daily as needed.    . bisacodyl (DULCOLAX) 10 MG suppository Place 10 mg rectally daily as needed for mild constipation or moderate constipation.     . celecoxib (CELEBREX) 50 MG capsule Take 50 mg by mouth daily.    . cyanocobalamin (,VITAMIN B-12,) 1000 MCG/ML injection Inject 1 mL (1,000 mcg total) into the muscle every 30 (thirty) days. 1 mL 5  . ergocalciferol (VITAMIN D2) 1.25 MG (50000 UT) capsule Take 1 capsule (50,000 Units total) by mouth once a week. 8 capsule 0  . hydrocortisone (ANUSOL-HC) 25 MG suppository Place 25 mg rectally daily as needed.    . ondansetron (ZOFRAN-ODT) 4 MG disintegrating tablet Take 4 mg by mouth every 8 (eight) hours as needed for nausea or vomiting.  (Patient not taking: Reported on 09/23/2019)    . pantoprazole (PROTONIX) 40 MG tablet Take 40 mg by mouth daily.    Marland Kitchen PARoxetine (PAXIL) 20 MG tablet Take 20 mg by mouth daily.    . polyethylene glycol (MIRALAX / GLYCOLAX) 17 g packet Take 17 g by mouth 2 (two) times a day.    . pregabalin (LYRICA) 50 MG capsule Take 50 mg by mouth 3 (three) times daily.    . Syringe/Needle, Disp, (SYRINGE 3CC/23GX1") 23G X 1" 3 ML MISC 1 Syringe by Does not apply route every 30 (thirty) days. 1 each 5  . temazepam (RESTORIL) 15 MG capsule Take 1  capsule (15 mg total) by mouth at bedtime. 30 capsule 0   No current facility-administered medications for this visit.    PHYSICAL EXAMINATION: ECOG PERFORMANCE STATUS: 1 - Symptomatic but completely ambulatory  Vitals:   05/04/20 1042  BP: 111/78  Pulse: 75  Resp: 17  Temp: 99 F (37.2 C)  SpO2: 100%   Filed Weights   05/04/20 1042  Weight: 123 lb 8 oz (56 kg)    GENERAL:alert, no distress and comfortable SKIN: skin color, texture, turgor are normal, no rashes or significant lesions EYES: normal, Conjunctiva are pink and non-injected, sclera clear  NECK: supple, thyroid normal size, non-tender, without nodularity LYMPH:  no palpable lymphadenopathy in the cervical, axillary  LUNGS: clear to auscultation and percussion with normal breathing effort HEART: regular rate & rhythm and no murmurs and no lower extremity edema ABDOMEN:abdomen soft, non-tender and normal bowel sounds (+)  Mild upper abdominal tenderness  Musculoskeletal:no cyanosis of digits and no clubbing  NEURO: alert & oriented x 3 with fluent speech, no focal motor/sensory deficits  LABORATORY DATA:  I have reviewed the data as listed CBC Latest Ref Rng & Units 05/04/2020 12/16/2019 09/23/2019  WBC 4.0 - 10.5 K/uL 9.7 6.8 6.7  Hemoglobin 12.0 - 15.0 g/dL 13.6 13.5 13.1  Hematocrit 36.0 - 46.0 % 42.4 40.8 40.3  Platelets 150 - 400 K/uL 753(H) 585(H) 574(H)     CMP Latest Ref Rng & Units 05/04/2020 12/16/2019 09/23/2019  Glucose 70 - 99 mg/dL 103(H) 49(L) 89  BUN 6 - 20 mg/dL 13 10 9   Creatinine 0.44 - 1.00 mg/dL 0.79 0.79 0.80  Sodium 135 - 145 mmol/L 137 142 141  Potassium 3.5 - 5.1 mmol/L 4.5 3.9 4.0  Chloride 98 - 111 mmol/L 104 109 103  CO2 22 - 32 mmol/L 28 28 27   Calcium 8.9 - 10.3 mg/dL 9.1 9.3 9.3  Total Protein 6.5 - 8.1 g/dL 7.4 7.1 7.4  Total Bilirubin 0.3 - 1.2 mg/dL 0.2(L) 0.5 0.5  Alkaline Phos 38 - 126 U/L 100 98 103  AST 15 - 41 U/L 17 16 19   ALT 0 - 44 U/L 12 20 22       RADIOGRAPHIC  STUDIES: I have personally reviewed the radiological images as listed and agreed with the findings in the report. No results found.   ASSESSMENT & PLAN:  Brooke Austin is a 50 y.o. female with    1. Iron deficiency and B12 deficiency anemia, thrombocytosis  -Secondary to poor absorption from gastrectomy and subsequent intussusception reduction. 09/2019 labs showed Hg 13.1, Ferritin 40, serum iron 135, TIBC 335, 40% transferrin saturation, and UIBC 200 -Menorrhagia may also contribute to IDA. She had a colonoscopy in 03/2019. She denies obvious GI bleeding unless she strains with constipation.  -Her thrombocytosis is likely reactive to IDA  -She previously did not tolerate oral iron, and received IV Venofer after gastrectomy in 02/2019. Restarted in 09/2019 as needed, but has not taken regularly.  -Due to B12 deficiency since gastrectomy, she will continue monthly B12 injections at home.  -She notes being more fatigued lately. Labs reviewed, CBC and CMP WNL except plt 753K, BG 103. I discussed this may be reactive. Her Iron panel, MMA is still pending. She is not anemic, but if her iron is low will give IV Iron.  -Continue monthly B12 injections and IV Venofer as needed if ferritin<30  -Repeat labs every 2 months locally. F/u in 6 months    2. Gastric Cancer, Stage I, pT1aN0M0 -Diagnosed in 07/2017. S/p total gastrectomydue to her strong family history of gastric cancer.  -Her cancer was very early stageand,s/p complete resection and her risk for recurrence is low.  -Most recent colonoscopy 03/2019. Unremarkable per patient report except for reported evidence of colitis.  -She has very small hernia at midline incision. Will monitor.  -She is currently on surveillance. Physical exam benign today. Today's CEA still pending. Plan to repeat Endoscopy in early 2022  3.Weight Management  -After gastrectomy she became malnourished with severe weight loss.  -After 4 month of TPN completed in  04/2019 she has gained adequate weight, now 120-130. Stable.   4. Constipation -Colonoscopy and Exam with Dr Juleen China on 04/15/19 showed colitis and possible IBS as the cause of her constipation. Patient also notes dumping syndrome and mild rectal bleeding with hard constipation.  -I also recommend Miralax to help her constipation.   5.  Chronic sciatica low back pain  -She sees orthopedicsurgeon, Dr. Junius Roads -Given severe spinal stenosis and back pain, she underwent back surgery on 01/04/20.  -She still has significant back pain after surgery as she is healing. She will continue PT.   6. Insomnia, Depression/Anxiety -Has been on Paxil, Mood stable.   7. Right breast calcifications and left breast mass -As seen on her 12/10/17 Mammogram and stable on 06/11/18 mammogram. Continue mammograms once a year, she is overdue   8. Genetic testing was negative for pathogenetic mutations.    PLAN: -Labs reviewed, ferritin 28, transferrin saturation slightly low 20, will give one dose iv venofer 400mg  in next few weeks  -Continue B12 monthly. I refilled today, will call her with B12 level   -lab at [facility] in 2 and 4 months -she will contact her GI Dr. Rogene Houston at [facility] for EGD in early 2022 -Lab and F/u in 6 months     No problem-specific Assessment & Plan notes found for this encounter.   No orders of the defined types were placed in this encounter.  All questions were answered. The patient knows to call the clinic with any problems, questions or concerns. No barriers to learning was detected. The total time spent in the appointment was 30 minutes.     Truitt Merle, MD 05/04/2020   I, Joslyn Devon, am acting as scribe for Truitt Merle, MD.   I have reviewed the above documentation for accuracy and completeness, and I agree with the above.

## 2020-05-04 ENCOUNTER — Other Ambulatory Visit: Payer: Self-pay

## 2020-05-04 ENCOUNTER — Other Ambulatory Visit: Payer: Self-pay | Admitting: Hematology

## 2020-05-04 ENCOUNTER — Inpatient Hospital Stay (HOSPITAL_BASED_OUTPATIENT_CLINIC_OR_DEPARTMENT_OTHER): Payer: Federal, State, Local not specified - PPO | Admitting: Hematology

## 2020-05-04 ENCOUNTER — Inpatient Hospital Stay: Payer: Federal, State, Local not specified - PPO | Attending: Hematology

## 2020-05-04 ENCOUNTER — Encounter: Payer: Self-pay | Admitting: Hematology

## 2020-05-04 VITALS — BP 111/78 | HR 75 | Temp 99.0°F | Resp 17 | Ht 68.0 in | Wt 123.5 lb

## 2020-05-04 DIAGNOSIS — F419 Anxiety disorder, unspecified: Secondary | ICD-10-CM | POA: Insufficient documentation

## 2020-05-04 DIAGNOSIS — D508 Other iron deficiency anemias: Secondary | ICD-10-CM

## 2020-05-04 DIAGNOSIS — Z79899 Other long term (current) drug therapy: Secondary | ICD-10-CM | POA: Insufficient documentation

## 2020-05-04 DIAGNOSIS — Z85028 Personal history of other malignant neoplasm of stomach: Secondary | ICD-10-CM | POA: Insufficient documentation

## 2020-05-04 DIAGNOSIS — C161 Malignant neoplasm of fundus of stomach: Secondary | ICD-10-CM

## 2020-05-04 DIAGNOSIS — K5900 Constipation, unspecified: Secondary | ICD-10-CM | POA: Diagnosis not present

## 2020-05-04 DIAGNOSIS — Z903 Acquired absence of stomach [part of]: Secondary | ICD-10-CM | POA: Diagnosis not present

## 2020-05-04 DIAGNOSIS — E538 Deficiency of other specified B group vitamins: Secondary | ICD-10-CM | POA: Insufficient documentation

## 2020-05-04 DIAGNOSIS — D75839 Thrombocytosis, unspecified: Secondary | ICD-10-CM | POA: Insufficient documentation

## 2020-05-04 DIAGNOSIS — G4700 Insomnia, unspecified: Secondary | ICD-10-CM | POA: Insufficient documentation

## 2020-05-04 DIAGNOSIS — F32A Depression, unspecified: Secondary | ICD-10-CM | POA: Diagnosis not present

## 2020-05-04 DIAGNOSIS — D509 Iron deficiency anemia, unspecified: Secondary | ICD-10-CM | POA: Insufficient documentation

## 2020-05-04 LAB — CMP (CANCER CENTER ONLY)
ALT: 12 U/L (ref 0–44)
AST: 17 U/L (ref 15–41)
Albumin: 3.9 g/dL (ref 3.5–5.0)
Alkaline Phosphatase: 100 U/L (ref 38–126)
Anion gap: 5 (ref 5–15)
BUN: 13 mg/dL (ref 6–20)
CO2: 28 mmol/L (ref 22–32)
Calcium: 9.1 mg/dL (ref 8.9–10.3)
Chloride: 104 mmol/L (ref 98–111)
Creatinine: 0.79 mg/dL (ref 0.44–1.00)
GFR, Estimated: >60 mL/min (ref >60)
Glucose, Bld: 103 mg/dL — ABNORMAL HIGH (ref 70–99)
Potassium: 4.5 mmol/L (ref 3.5–5.1)
Sodium: 137 mmol/L (ref 135–145)
Total Bilirubin: 0.2 mg/dL — ABNORMAL LOW (ref 0.3–1.2)
Total Protein: 7.4 g/dL (ref 6.5–8.1)

## 2020-05-04 LAB — CBC WITH DIFFERENTIAL (CANCER CENTER ONLY)
Abs Immature Granulocytes: 0.03 10*3/uL (ref 0.00–0.07)
Basophils Absolute: 0.1 10*3/uL (ref 0.0–0.1)
Basophils Relative: 1 %
Eosinophils Absolute: 0.2 10*3/uL (ref 0.0–0.5)
Eosinophils Relative: 2 %
HCT: 42.4 % (ref 36.0–46.0)
Hemoglobin: 13.6 g/dL (ref 12.0–15.0)
Immature Granulocytes: 0 %
Lymphocytes Relative: 18 %
Lymphs Abs: 1.7 10*3/uL (ref 0.7–4.0)
MCH: 29.6 pg (ref 26.0–34.0)
MCHC: 32.1 g/dL (ref 30.0–36.0)
MCV: 92.2 fL (ref 80.0–100.0)
Monocytes Absolute: 0.6 10*3/uL (ref 0.1–1.0)
Monocytes Relative: 6 %
Neutro Abs: 7.2 10*3/uL (ref 1.7–7.7)
Neutrophils Relative %: 73 %
Platelet Count: 753 10*3/uL — ABNORMAL HIGH (ref 150–400)
RBC: 4.6 MIL/uL (ref 3.87–5.11)
RDW: 14.2 % (ref 11.5–15.5)
WBC Count: 9.7 10*3/uL (ref 4.0–10.5)
nRBC: 0 % (ref 0.0–0.2)

## 2020-05-04 LAB — IRON AND TIBC
Iron: 59 ug/dL (ref 41–142)
Saturation Ratios: 20 % — ABNORMAL LOW (ref 21–57)
TIBC: 293 ug/dL (ref 236–444)
UIBC: 233 ug/dL (ref 120–384)

## 2020-05-04 LAB — CEA (IN HOUSE-CHCC): CEA (CHCC-In House): 4.02 ng/mL (ref 0.00–5.00)

## 2020-05-04 LAB — FERRITIN: Ferritin: 28 ng/mL (ref 11–307)

## 2020-05-04 MED ORDER — CYANOCOBALAMIN 1000 MCG/ML IJ SOLN: 1000.0000 ug | INTRAMUSCULAR | 5 refills | Status: DC

## 2020-05-04 MED FILL — CYANOCOBALAMIN 1,000 MCG/ML: 1000 | 30 days supply | Qty: 1 | Fill #0

## 2020-05-06 ENCOUNTER — Other Ambulatory Visit: Payer: Self-pay | Admitting: Hematology

## 2020-05-07 ENCOUNTER — Telehealth: Payer: Self-pay | Admitting: Hematology

## 2020-05-07 LAB — METHYLMALONIC ACID, SERUM: Methylmalonic Acid, Quantitative: 126 nmol/L (ref 0–378)

## 2020-05-07 NOTE — Telephone Encounter (Signed)
Scheduled follow-up appointment per 2/11 los. Patient is aware.

## 2020-05-07 NOTE — Telephone Encounter (Signed)
Informed patient of her upcoming appointments. Patient is aware.

## 2020-05-08 ENCOUNTER — Telehealth: Payer: Self-pay | Admitting: Hematology

## 2020-05-08 NOTE — Telephone Encounter (Signed)
Scheduled appt per 2/11 sch msg - pt is aware of appt on 2/18

## 2020-05-11 ENCOUNTER — Inpatient Hospital Stay: Payer: Federal, State, Local not specified - PPO

## 2020-05-11 ENCOUNTER — Other Ambulatory Visit: Payer: Self-pay

## 2020-05-11 VITALS — BP 104/62 | HR 66 | Temp 98.0°F | Resp 18

## 2020-05-11 DIAGNOSIS — D508 Other iron deficiency anemias: Secondary | ICD-10-CM

## 2020-05-11 DIAGNOSIS — D509 Iron deficiency anemia, unspecified: Secondary | ICD-10-CM | POA: Diagnosis not present

## 2020-05-11 MED ORDER — SODIUM CHLORIDE 0.9 % IV SOLN
400.0000 mg | Freq: Once | INTRAVENOUS | Status: AC
  Administered 2020-05-11: 400 mg via INTRAVENOUS
  Filled 2020-05-11: qty 20

## 2020-05-11 MED ORDER — SODIUM CHLORIDE 0.9 % IV SOLN
Freq: Once | INTRAVENOUS | Status: AC
  Filled 2020-05-11: qty 250

## 2020-05-11 NOTE — Progress Notes (Signed)
Patient observed for 15 minutes post infusion. Declined to stay for 30 minutes.  VSS upon leaving unit.

## 2020-05-16 ENCOUNTER — Other Ambulatory Visit (HOSPITAL_COMMUNITY): Payer: Self-pay | Admitting: Internal Medicine

## 2020-05-16 MED FILL — SERTRALINE HCL 25 MG TABLET: 25 | 90 days supply | Qty: 90 | Fill #0

## 2020-05-28 MED FILL — TEMAZEPAM 15 MG CAPSULE: 15 | 30 days supply | Qty: 30 | Fill #0

## 2020-05-31 MED FILL — MIRTAZAPINE 15 MG TABS: 15 | 30 days supply | Qty: 30 | Fill #0

## 2020-06-14 ENCOUNTER — Other Ambulatory Visit (HOSPITAL_BASED_OUTPATIENT_CLINIC_OR_DEPARTMENT_OTHER): Payer: Self-pay

## 2020-06-14 ENCOUNTER — Ambulatory Visit: Payer: Federal, State, Local not specified - PPO | Admitting: Hematology

## 2020-06-14 ENCOUNTER — Other Ambulatory Visit: Payer: Federal, State, Local not specified - PPO

## 2020-06-18 MED FILL — CYANOCOBALAMIN 1,000 MCG/ML: 1000 | 60 days supply | Qty: 2 | Fill #1

## 2020-06-30 ENCOUNTER — Other Ambulatory Visit (HOSPITAL_COMMUNITY): Payer: Self-pay

## 2020-06-30 MED FILL — Temazepam Cap 15 MG: ORAL | Qty: 30 | Fill #0 | Status: CN

## 2020-07-02 ENCOUNTER — Telehealth: Payer: Self-pay | Admitting: *Deleted

## 2020-07-02 ENCOUNTER — Inpatient Hospital Stay: Payer: Federal, State, Local not specified - PPO

## 2020-07-02 NOTE — Telephone Encounter (Signed)
Patient wishes to cancel her lab appointment for today and did not want to reschedule.

## 2020-07-03 ENCOUNTER — Other Ambulatory Visit (HOSPITAL_COMMUNITY): Payer: Self-pay

## 2020-07-03 MED ORDER — HYDROCODONE-ACETAMINOPHEN 5-325 MG PO TABS
ORAL_TABLET | ORAL | 0 refills | Status: DC
  Filled 2020-07-03: qty 90, 30d supply, fill #0

## 2020-07-04 ENCOUNTER — Other Ambulatory Visit (HOSPITAL_COMMUNITY): Payer: Self-pay

## 2020-07-04 MED ORDER — PANTOPRAZOLE SODIUM 40 MG PO TBEC
1.0000 | DELAYED_RELEASE_TABLET | Freq: Every day | ORAL | 1 refills | Status: DC
  Filled 2020-07-04: qty 30, 30d supply, fill #0
  Filled 2020-07-26: qty 30, 30d supply, fill #1

## 2020-07-04 MED ORDER — SERTRALINE HCL 50 MG PO TABS
1.0000 | ORAL_TABLET | Freq: Every day | ORAL | 1 refills | Status: AC
  Filled 2020-07-04: qty 90, 90d supply, fill #0

## 2020-07-05 ENCOUNTER — Other Ambulatory Visit (HOSPITAL_COMMUNITY): Payer: Self-pay

## 2020-07-05 MED ORDER — TEMAZEPAM 15 MG PO CAPS
ORAL_CAPSULE | ORAL | 3 refills | Status: DC
  Filled 2020-07-05: qty 30, 30d supply, fill #0

## 2020-07-05 MED ORDER — ONDANSETRON HCL 8 MG PO TABS
ORAL_TABLET | ORAL | 3 refills | Status: AC
  Filled 2020-07-05: qty 20, 10d supply, fill #0
  Filled 2020-07-26: qty 20, 10d supply, fill #1

## 2020-07-05 MED ORDER — MIRTAZAPINE 15 MG PO TABS
ORAL_TABLET | ORAL | 11 refills | Status: AC
  Filled 2020-07-05: qty 30, 30d supply, fill #0
  Filled 2020-07-26: qty 30, 30d supply, fill #1
  Filled 2020-09-06: qty 30, 30d supply, fill #2
  Filled 2020-09-21: qty 30, 30d supply, fill #3

## 2020-07-19 ENCOUNTER — Other Ambulatory Visit (HOSPITAL_COMMUNITY): Payer: Self-pay

## 2020-07-26 ENCOUNTER — Other Ambulatory Visit (HOSPITAL_COMMUNITY): Payer: Self-pay

## 2020-07-26 MED FILL — Temazepam Cap 15 MG: ORAL | 30 days supply | Qty: 30 | Fill #0 | Status: AC

## 2020-07-26 MED FILL — Baclofen Tab 10 MG: ORAL | 20 days supply | Qty: 60 | Fill #0 | Status: AC

## 2020-07-27 ENCOUNTER — Other Ambulatory Visit (HOSPITAL_COMMUNITY): Payer: Self-pay

## 2020-07-28 ENCOUNTER — Other Ambulatory Visit (HOSPITAL_COMMUNITY): Payer: Self-pay

## 2020-07-29 IMAGING — MR MR CERVICAL SPINE W/O CM
6 of 7 series · 24 of 48 positions shown · non-contrast
Comparison: Head CT 12/07/2018

CLINICAL DATA: Radiculopathy. Left hand weakness. History of
gastric cancer status post gastrectomy in 1298.

EXAM:
MRI HEAD WITHOUT CONTRAST
MRI CERVICAL SPINE WITHOUT CONTRAST
TECHNIQUE: Multiplanar, multiecho pulse sequences of the brain and surrounding
structures, and cervical spine, to include the craniocervical
junction and cervicothoracic junction, were obtained without
intravenous contrast.

[Series 8: T1 · axial · non-contrast · 3.0mm · 0.94mm/px · z∈[-111,+31]mm · 8 of 52 slices shown]
[im 1/52]
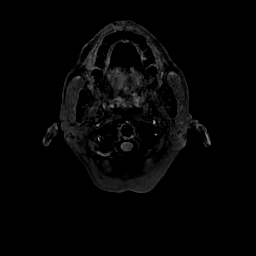
[im 8/52]
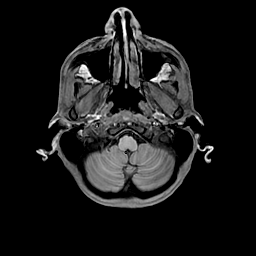
[im 15/52]
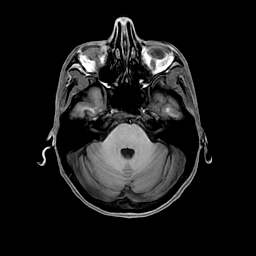
[im 22/52]
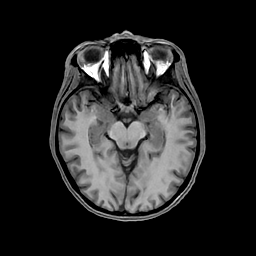
[im 30/52]
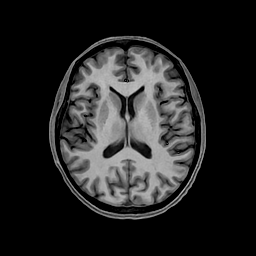
[im 37/52]
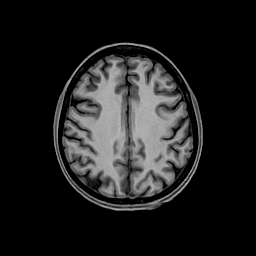
[im 44/52]
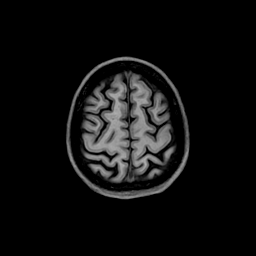
[im 52/52]
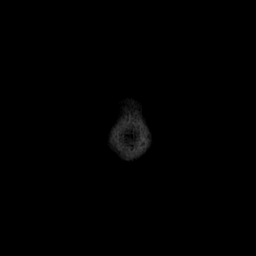

[Series 9: T2 · coronal · 5.0mm · 0.47mm/px · 4 of 29 slices shown (1 of 3)]
[im 1/29]
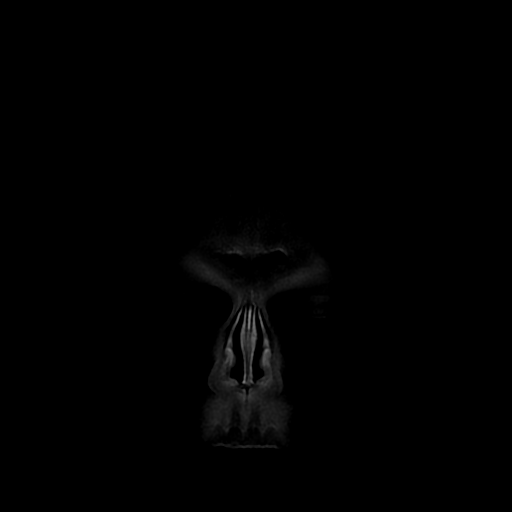
[im 10/29]
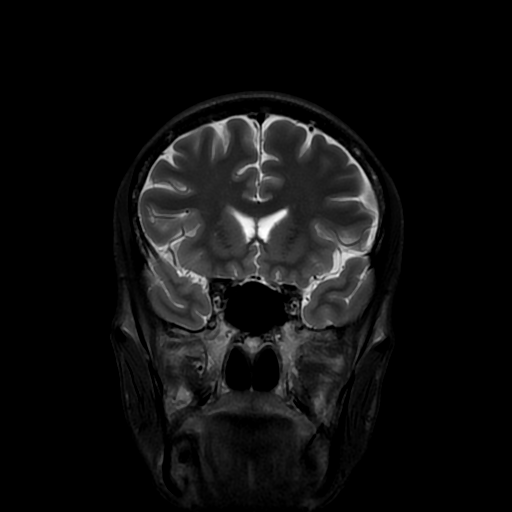
[im 19/29]
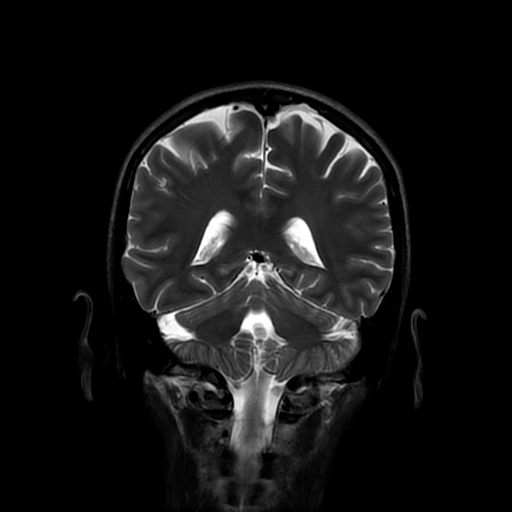
[im 29/29]
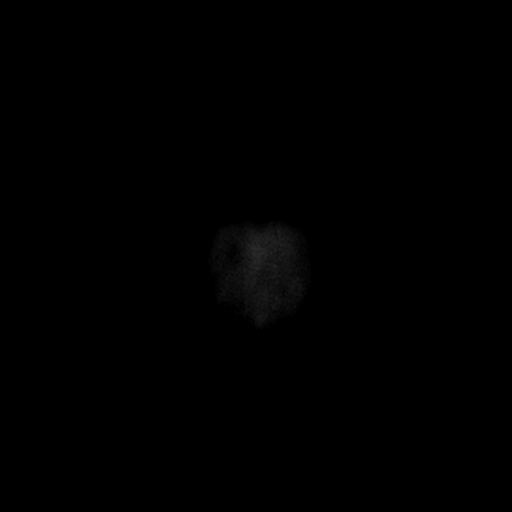

[Series 11: T2 · sagittal · 3.0mm · 0.43mm/px · 2 of 14 slices shown (2 of 3)]
[im 1/14]
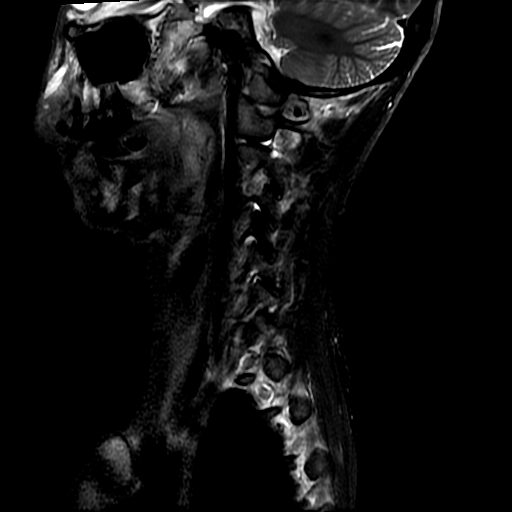
[im 14/14]
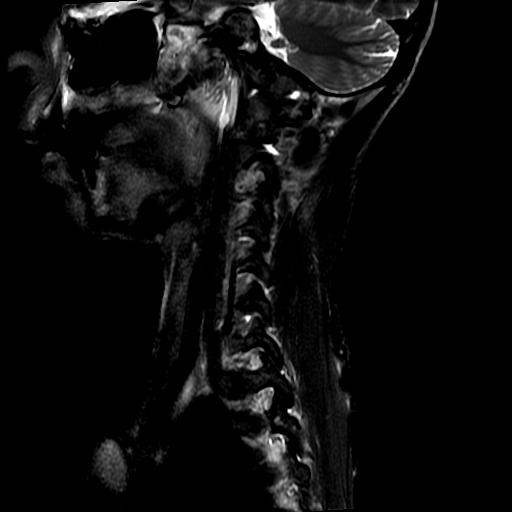

[Series 13: STIR · sagittal · 3.0mm · 0.43mm/px · 2 of 14 slices shown]
[im 1/14]
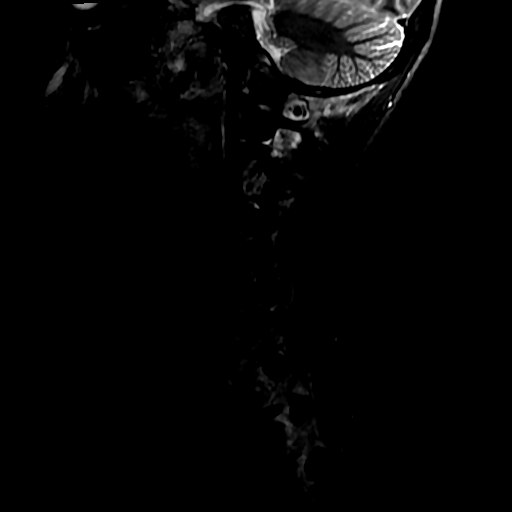
[im 14/14]
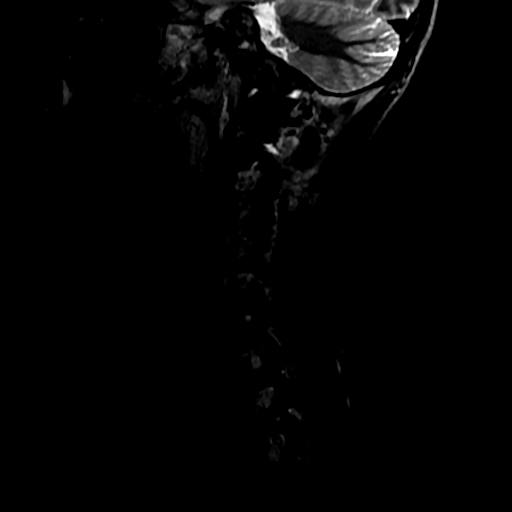

[Series 16: T2 · axial · 3.0mm · 0.35mm/px · z∈[-203,-116]mm · 4 of 28 slices shown (3 of 3)]
[im 1/28]
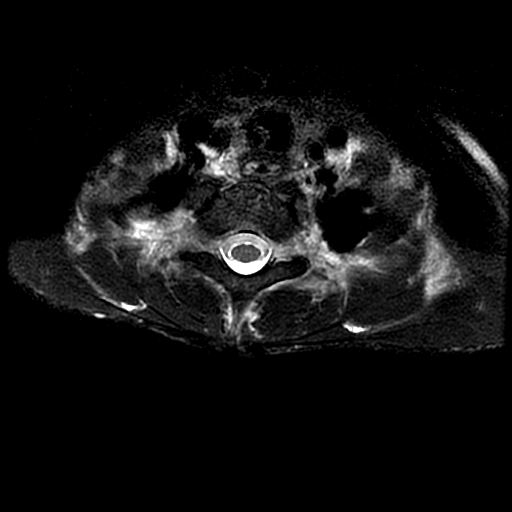
[im 10/28]
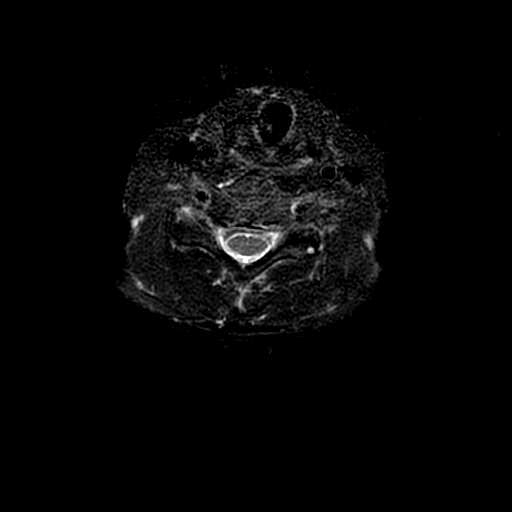
[im 19/28]
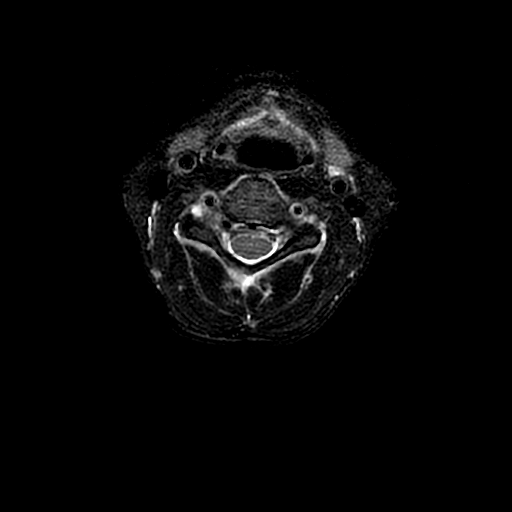
[im 28/28]
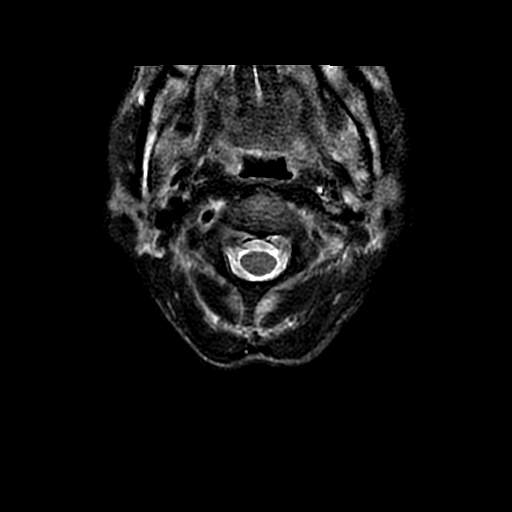

[Series 700: SWI · axial · 3.0mm · 0.47mm/px · z∈[-103,-46]mm · 4 of 103 slices shown]
[im 7/103]
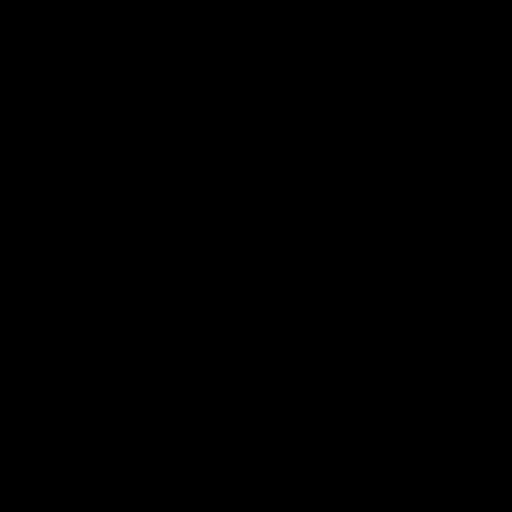
[im 21/103]
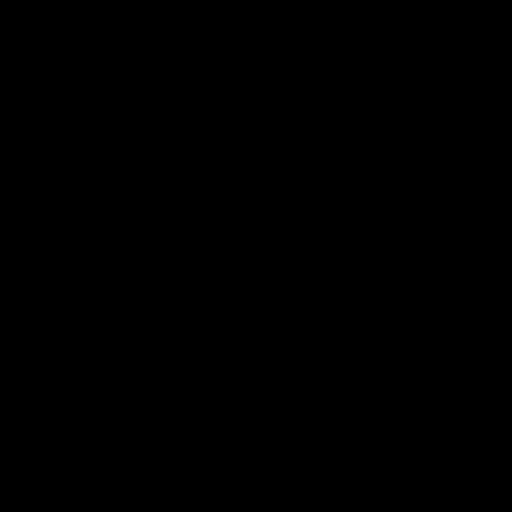
[im 35/103]
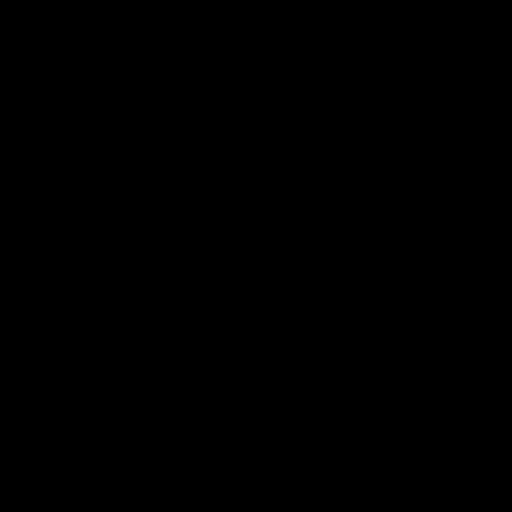
[im 48/103]
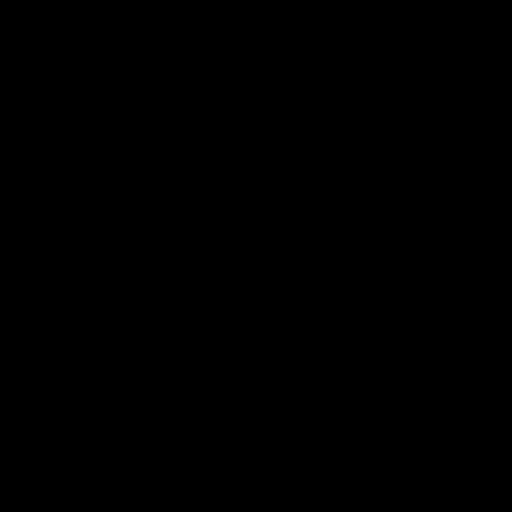

[24 of 48 positions shown; findings below may reference images not displayed]

FINDINGS: MRI HEAD FINDINGS

Brain: There is no evidence of acute infarct, intracranial
hemorrhage, intracranial mass effect, or extra-axial fluid
collection. The ventricles and sulci are normal. A homogeneously T1
hyperintense lesion in the posterior aspect of the pituitary gland
measures 10 x 4 x 6 mm without suprasellar extension or regional
mass effect. The brain is otherwise normal in signal.

Vascular: Major intracranial vascular flow voids are preserved.

Skull and upper cervical spine: Unremarkable bone marrow signal.

Sinuses/Orbits: Unremarkable orbits. Paranasal sinuses and mastoid
air cells are clear.

Other: None.

MRI CERVICAL SPINE FINDINGS

Alignment: Straightening of the normal cervical lordosis. No
listhesis.

Vertebrae: No fracture or suspicious marrow lesion. Mild multilevel
degenerative endplate changes including mild edema, greatest at
C6-7.

Cord: Normal signal and morphology.

Posterior Fossa, vertebral arteries, paraspinal tissues:
Unremarkable.

Disc levels:

C2-3: Negative.

C3-4: Uncovertebral spurring and minimal disc bulging without
stenosis.

C4-5: Minimal disc bulging and uncovertebral spurring without
stenosis.

C5-6: Minimal disc bulging and uncovertebral spurring without
stenosis.

C6-7: Disc bulging and uncovertebral spurring asymmetric to the left
result in mild to moderate left neural foraminal stenosis at C6-7
without spinal stenosis.

C7-T1: Negative.
IMPRESSION: 1. No acute intracranial abnormality.
2. 10 mm T1 hyperintense pituitary lesion, most likely a Rathke's
cleft cyst. No further imaging evaluation or follow-up is necessary.
Consider endocrine function tests and correlate for history of
pituitary hypersecretion. This follows ACR consensus guidelines:
Management of Incidental Pituitary Findings on CT, MRI and F18-FDG
PET: A White Paper of the ACR Incidental Findings Committee. [HOSPITAL] 7122; 15: 966-72.
3. Mild cervical disc degeneration, greatest at C6-7 where there is
mild-to-moderate left neural foraminal stenosis. No spinal stenosis.

## 2020-08-09 ENCOUNTER — Other Ambulatory Visit (HOSPITAL_COMMUNITY): Payer: Self-pay

## 2020-08-14 ENCOUNTER — Other Ambulatory Visit (HOSPITAL_COMMUNITY): Payer: Self-pay

## 2020-08-14 MED ORDER — PREGABALIN 25 MG PO CAPS
50.0000 mg | ORAL_CAPSULE | Freq: Two times a day (BID) | ORAL | 4 refills | Status: AC
  Filled 2020-08-14: qty 120, 30d supply, fill #0
  Filled 2020-09-21: qty 120, 30d supply, fill #1

## 2020-08-14 MED ORDER — HYDROCODONE-ACETAMINOPHEN 5-325 MG PO TABS
ORAL_TABLET | ORAL | 0 refills | Status: AC
  Filled 2020-08-14: qty 24, 8d supply, fill #0

## 2020-08-15 ENCOUNTER — Other Ambulatory Visit (HOSPITAL_COMMUNITY): Payer: Self-pay

## 2020-08-31 ENCOUNTER — Other Ambulatory Visit: Payer: Federal, State, Local not specified - PPO

## 2020-09-05 ENCOUNTER — Other Ambulatory Visit: Payer: Federal, State, Local not specified - PPO

## 2020-09-05 ENCOUNTER — Telehealth: Payer: Self-pay

## 2020-09-06 ENCOUNTER — Other Ambulatory Visit (HOSPITAL_COMMUNITY): Payer: Self-pay

## 2020-09-06 MED FILL — Cyanocobalamin Inj 1000 MCG/ML: INTRAMUSCULAR | 30 days supply | Qty: 1 | Fill #0 | Status: AC

## 2020-09-07 ENCOUNTER — Other Ambulatory Visit (HOSPITAL_COMMUNITY): Payer: Self-pay

## 2020-09-07 MED ORDER — TEMAZEPAM 15 MG PO CAPS
ORAL_CAPSULE | ORAL | 3 refills | Status: AC
  Filled 2020-09-07 – 2020-09-21 (×2): qty 30, 30d supply, fill #0
  Filled 2020-10-19: qty 30, 30d supply, fill #1

## 2020-09-07 MED ORDER — CYANOCOBALAMIN 1000 MCG/ML IJ SOLN
INTRAMUSCULAR | 0 refills | Status: AC
  Filled 2020-09-07: qty 12, 84d supply, fill #0
  Filled 2020-09-21: qty 3, 90d supply, fill #0

## 2020-09-07 MED ORDER — ONDANSETRON HCL 8 MG PO TABS
ORAL_TABLET | ORAL | 3 refills | Status: DC
  Filled 2020-09-07 – 2020-10-01 (×2): qty 20, 10d supply, fill #0

## 2020-09-11 ENCOUNTER — Inpatient Hospital Stay: Payer: Federal, State, Local not specified - PPO

## 2020-09-11 ENCOUNTER — Other Ambulatory Visit (HOSPITAL_COMMUNITY): Payer: Self-pay

## 2020-09-12 ENCOUNTER — Other Ambulatory Visit (HOSPITAL_COMMUNITY): Payer: Self-pay

## 2020-09-13 ENCOUNTER — Other Ambulatory Visit (HOSPITAL_COMMUNITY): Payer: Self-pay

## 2020-09-14 ENCOUNTER — Other Ambulatory Visit (HOSPITAL_COMMUNITY): Payer: Self-pay

## 2020-09-18 ENCOUNTER — Other Ambulatory Visit (HOSPITAL_COMMUNITY): Payer: Self-pay

## 2020-09-18 ENCOUNTER — Inpatient Hospital Stay: Payer: Federal, State, Local not specified - PPO

## 2020-09-19 ENCOUNTER — Other Ambulatory Visit (HOSPITAL_COMMUNITY): Payer: Self-pay

## 2020-09-20 ENCOUNTER — Other Ambulatory Visit (HOSPITAL_COMMUNITY): Payer: Self-pay

## 2020-09-20 ENCOUNTER — Other Ambulatory Visit: Payer: Self-pay

## 2020-09-20 ENCOUNTER — Inpatient Hospital Stay: Payer: Federal, State, Local not specified - PPO | Attending: Hematology & Oncology

## 2020-09-20 DIAGNOSIS — Z85028 Personal history of other malignant neoplasm of stomach: Secondary | ICD-10-CM

## 2020-09-20 DIAGNOSIS — Z903 Acquired absence of stomach [part of]: Secondary | ICD-10-CM | POA: Insufficient documentation

## 2020-09-20 DIAGNOSIS — K912 Postsurgical malabsorption, not elsewhere classified: Secondary | ICD-10-CM | POA: Insufficient documentation

## 2020-09-20 DIAGNOSIS — C161 Malignant neoplasm of fundus of stomach: Secondary | ICD-10-CM | POA: Diagnosis present

## 2020-09-20 DIAGNOSIS — D508 Other iron deficiency anemias: Secondary | ICD-10-CM | POA: Insufficient documentation

## 2020-09-20 LAB — CMP (CANCER CENTER ONLY)
ALT: 21 U/L (ref 0–44)
AST: 19 U/L (ref 15–41)
Albumin: 4.1 g/dL (ref 3.5–5.0)
Alkaline Phosphatase: 129 U/L — ABNORMAL HIGH (ref 38–126)
Anion gap: 6 (ref 5–15)
BUN: 13 mg/dL (ref 6–20)
CO2: 31 mmol/L (ref 22–32)
Calcium: 9.6 mg/dL (ref 8.9–10.3)
Chloride: 99 mmol/L (ref 98–111)
Creatinine: 0.75 mg/dL (ref 0.44–1.00)
GFR, Estimated: >60 mL/min (ref >60)
Glucose, Bld: 108 mg/dL — ABNORMAL HIGH (ref 70–99)
Potassium: 4.2 mmol/L (ref 3.5–5.1)
Sodium: 136 mmol/L (ref 135–145)
Total Bilirubin: 0.3 mg/dL (ref 0.3–1.2)
Total Protein: 7 g/dL (ref 6.5–8.1)

## 2020-09-20 LAB — CBC WITH DIFFERENTIAL (CANCER CENTER ONLY)
Abs Immature Granulocytes: 0.02 10*3/uL (ref 0.00–0.07)
Basophils Absolute: 0.1 10*3/uL (ref 0.0–0.1)
Basophils Relative: 1 %
Eosinophils Absolute: 0.2 10*3/uL (ref 0.0–0.5)
Eosinophils Relative: 3 %
HCT: 39.7 % (ref 36.0–46.0)
Hemoglobin: 13 g/dL (ref 12.0–15.0)
Immature Granulocytes: 0 %
Lymphocytes Relative: 18 %
Lymphs Abs: 1.4 10*3/uL (ref 0.7–4.0)
MCH: 30.7 pg (ref 26.0–34.0)
MCHC: 32.7 g/dL (ref 30.0–36.0)
MCV: 93.6 fL (ref 80.0–100.0)
Monocytes Absolute: 0.3 10*3/uL (ref 0.1–1.0)
Monocytes Relative: 4 %
Neutro Abs: 5.8 10*3/uL (ref 1.7–7.7)
Neutrophils Relative %: 74 %
Platelet Count: 737 10*3/uL — ABNORMAL HIGH (ref 150–400)
RBC: 4.24 MIL/uL (ref 3.87–5.11)
RDW: 13.5 % (ref 11.5–15.5)
WBC Count: 7.8 10*3/uL (ref 4.0–10.5)
nRBC: 0 % (ref 0.0–0.2)

## 2020-09-21 ENCOUNTER — Encounter: Payer: Self-pay | Admitting: Hematology

## 2020-09-21 ENCOUNTER — Other Ambulatory Visit (HOSPITAL_COMMUNITY): Payer: Self-pay

## 2020-09-21 LAB — CEA (IN HOUSE-CHCC): CEA (CHCC-In House): 5.11 ng/mL — ABNORMAL HIGH (ref 0.00–5.00)

## 2020-09-21 LAB — IRON AND TIBC
Iron: 64 ug/dL (ref 41–142)
Saturation Ratios: 22 % (ref 21–57)
TIBC: 288 ug/dL (ref 236–444)
UIBC: 224 ug/dL (ref 120–384)

## 2020-09-21 LAB — FERRITIN: Ferritin: 71 ng/mL (ref 11–307)

## 2020-09-21 MED ORDER — BACLOFEN 10 MG PO TABS
10.0000 mg | ORAL_TABLET | Freq: Three times a day (TID) | ORAL | 2 refills | Status: AC | PRN
  Filled 2020-09-21 – 2020-09-25 (×2): qty 60, 20d supply, fill #0

## 2020-09-25 ENCOUNTER — Other Ambulatory Visit (HOSPITAL_COMMUNITY): Payer: Self-pay

## 2020-09-25 LAB — METHYLMALONIC ACID, SERUM: Methylmalonic Acid, Quantitative: 166 nmol/L (ref 0–378)

## 2020-09-26 ENCOUNTER — Other Ambulatory Visit (HOSPITAL_COMMUNITY): Payer: Self-pay

## 2020-09-27 ENCOUNTER — Telehealth: Payer: Self-pay | Admitting: *Deleted

## 2020-09-27 ENCOUNTER — Other Ambulatory Visit (HOSPITAL_COMMUNITY): Payer: Self-pay

## 2020-09-27 NOTE — Telephone Encounter (Signed)
Per Dr. Burr Medico communicated lab results to patient.  She denies having any further questions at this time.  Her follow up appointments are already scheduled.

## 2020-09-30 ENCOUNTER — Encounter: Payer: Self-pay | Admitting: Hematology

## 2020-10-01 ENCOUNTER — Other Ambulatory Visit (HOSPITAL_COMMUNITY): Payer: Self-pay

## 2020-10-19 ENCOUNTER — Encounter: Payer: Self-pay | Admitting: Hematology

## 2020-10-19 ENCOUNTER — Other Ambulatory Visit (HOSPITAL_COMMUNITY): Payer: Self-pay

## 2020-11-02 ENCOUNTER — Inpatient Hospital Stay: Payer: Federal, State, Local not specified - PPO

## 2020-11-02 ENCOUNTER — Other Ambulatory Visit: Payer: Self-pay

## 2020-11-02 ENCOUNTER — Inpatient Hospital Stay: Payer: Federal, State, Local not specified - PPO | Attending: Hematology | Admitting: Hematology

## 2020-11-02 VITALS — BP 138/79 | HR 78 | Temp 98.1°F | Resp 16 | Ht 68.0 in | Wt 122.6 lb

## 2020-11-02 DIAGNOSIS — C161 Malignant neoplasm of fundus of stomach: Secondary | ICD-10-CM

## 2020-11-02 DIAGNOSIS — C169 Malignant neoplasm of stomach, unspecified: Secondary | ICD-10-CM | POA: Diagnosis not present

## 2020-11-02 DIAGNOSIS — D75839 Thrombocytosis, unspecified: Secondary | ICD-10-CM | POA: Diagnosis not present

## 2020-11-02 DIAGNOSIS — E538 Deficiency of other specified B group vitamins: Secondary | ICD-10-CM | POA: Diagnosis not present

## 2020-11-02 DIAGNOSIS — D508 Other iron deficiency anemias: Secondary | ICD-10-CM

## 2020-11-02 DIAGNOSIS — D509 Iron deficiency anemia, unspecified: Secondary | ICD-10-CM | POA: Diagnosis not present

## 2020-11-02 DIAGNOSIS — D519 Vitamin B12 deficiency anemia, unspecified: Secondary | ICD-10-CM | POA: Diagnosis not present

## 2020-11-02 LAB — CBC WITH DIFFERENTIAL/PLATELET
Abs Immature Granulocytes: 0.02 10*3/uL (ref 0.00–0.07)
Basophils Absolute: 0.1 10*3/uL (ref 0.0–0.1)
Basophils Relative: 1 %
Eosinophils Absolute: 0.4 10*3/uL (ref 0.0–0.5)
Eosinophils Relative: 5 %
HCT: 39.3 % (ref 36.0–46.0)
Hemoglobin: 12.9 g/dL (ref 12.0–15.0)
Immature Granulocytes: 0 %
Lymphocytes Relative: 29 %
Lymphs Abs: 2.4 10*3/uL (ref 0.7–4.0)
MCH: 30.8 pg (ref 26.0–34.0)
MCHC: 32.8 g/dL (ref 30.0–36.0)
MCV: 93.8 fL (ref 80.0–100.0)
Monocytes Absolute: 0.6 10*3/uL (ref 0.1–1.0)
Monocytes Relative: 7 %
Neutro Abs: 4.9 10*3/uL (ref 1.7–7.7)
Neutrophils Relative %: 58 %
Platelets: 743 10*3/uL — ABNORMAL HIGH (ref 150–400)
RBC: 4.19 MIL/uL (ref 3.87–5.11)
RDW: 13.8 % (ref 11.5–15.5)
WBC: 8.3 10*3/uL (ref 4.0–10.5)
nRBC: 0 % (ref 0.0–0.2)

## 2020-11-02 LAB — COMPREHENSIVE METABOLIC PANEL
ALT: 27 U/L (ref 0–44)
AST: 27 U/L (ref 15–41)
Albumin: 3.8 g/dL (ref 3.5–5.0)
Alkaline Phosphatase: 118 U/L (ref 38–126)
Anion gap: 10 (ref 5–15)
BUN: 13 mg/dL (ref 6–20)
CO2: 25 mmol/L (ref 22–32)
Calcium: 9 mg/dL (ref 8.9–10.3)
Chloride: 105 mmol/L (ref 98–111)
Creatinine, Ser: 0.96 mg/dL (ref 0.44–1.00)
GFR, Estimated: >60 mL/min (ref >60)
Glucose, Bld: 125 mg/dL — ABNORMAL HIGH (ref 70–99)
Potassium: 4 mmol/L (ref 3.5–5.1)
Sodium: 140 mmol/L (ref 135–145)
Total Bilirubin: 0.2 mg/dL — ABNORMAL LOW (ref 0.3–1.2)
Total Protein: 7.2 g/dL (ref 6.5–8.1)

## 2020-11-02 LAB — MAGNESIUM: Magnesium: 1.9 mg/dL (ref 1.7–2.4)

## 2020-11-02 LAB — VITAMIN B12: Vitamin B-12: 284 pg/mL (ref 180–914)

## 2020-11-02 NOTE — Progress Notes (Signed)
[place]   [phone] [phone]   Clinic Follow up Note   Patient Care Team: Haydee Salter, MD as PCP - General (Internal Medicine)  Date of Service:  11/02/2020  CHIEF COMPLAINT: f/u of gastric cancer and iron deficiency  SUMMARY OF ONCOLOGIC HISTORY: Oncology History Overview Note  Cancer Staging Malignant neoplasm of stomach [facility]) Staging form: Stomach, AJCC 8th Edition - Pathologic stage from 10/04/2018: Stage IA (pT1a, pN0, cM0) - Signed by Truitt Merle, MD on 10/04/2018    Malignant neoplasm of stomach ([place])  07/23/2017 Initial Biopsy   07/23/17 EGD revealed malignant gastric ulcer. 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   07/27/2017 Initial Diagnosis   Malignant neoplasm of stomach ([place])   07/28/2017 Imaging   CT CAP W Contrast 07/28/17 [facility] IMPRESSION: 1. The primary gastric lesion is not readily apparent on this study. 2. Borderline enlarged gastrohepatic ligament node measures 1 cm. Additionally there is a ovoid soft tissue attenuating nodule within the left periaortic retroperitoneum which is indeterminate. Periaortic adenopathy cannot be excluded. Consider further evaluation with PET-CT. 3. Tiny nonspecific nodule in the left upper lobe measures 4 mm.   07/30/2017 PET scan   PET 07/30/17 [facility] IMPRESSION: 1. No hypermetabolic mass or adenopathy to suggest metastatic disease. 2. Low level FDG uptake associated with the periaortic left retroperitoneal nodule compatible with a benign etiology. 3. 4 mm left upper lobe lung nodule is too small to characterize by PET-CT.   08/19/2017 Surgery   Total Gastrectomy 08/19/2017 at [facility]   08/19/2017 Pathology Results   FINAL PATHOLOGIC DIAGNOSIS from [facility] MICROSCOPIC EXAMINATION AND DIAGNOSIS  A.  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 The tumor invades into the lamina propria. Angiolymphatic invasion is not identified. Perineural invasion is not identified.       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       Glandular mucosa with intestinal metaplasia in the distal esophagus consistent with specialized Barrett's mucosa; negative for dysplasia.       Multiple (49) perigastric lymph nodes are negative for tumor (0/49).       Squamous esophageal mucosa at the proximal surgical margin; negative for tumor.       Duodenal mucosa at the distal surgical margin; negative for tumor,       All surgical margins are negative for tumor (minimum tumor free margin, 7.0 cm, distal surgical margin).  B.  LYMPH NODES, HEPATIC ARTERY, EXCISION:      Multiple (8) hepatic artery lymph nodes are negative for tumor (0/8).  C.  LYMPH NODES, SPLENIC ARTERY, EXCISION:      Multiple (2) splenic artery lymph nodes are negative for    12/07/2017 Imaging   CT CAP W Constrast 12/07/17 [facility] IMPRESSION: 1. Status post gastrectomy, without residual or recurrent disease. 2. Left periaortic adenopathy, felt to be similar to the prior exam. Not FDG avid on interval PE T. Favored to be reactive. Recommend attention on follow-up. 3.  Aortic Atherosclerosis (ICD10-I70.0).  This is age advanced. 4. Tiny bilateral pulmonary nodules are unchanged, favoring a benign etiology.   12/10/2017 Mammogram   Diagnostic Mammogram 12/10/17 [facility] FINDINGS:  Mammographically, there is a group of punctate calcifications in the retroareolar right breast anterior third. Oval mass with circumscribed margins in the outer central aspect of  the left breast middle third. On physical examination, there is no palpable abnormality in the left breast at the area of mammographic concern. On targeted ultrasound, there is no sonographic correlate for the mammographic finding. Incidental  intramammary lymph node measuring 8 x 6 x 2 mm at the 3:00 position of the left breast 4 cm from the nipple.   02/26/2018 Imaging   CT AP W contrast 02/26/18 [facility] IMPRESSION: 1. Interval improvement or near complete resolution of the previously seen colitis involving the transverse colon. 2. Mildly thickened segment of small bowel in the left hemipelvis concerning for enteritis. Clinical correlation is recommended. No bowel obstruction. Normal appendix. 3. Large amount of stool throughout the colon. 4. A 3.5 cm left ovarian complex/hemorrhagic cyst. Ultrasound may provide better evaluation of the pelvic structures. Probable right ovarian corpus luteum.    03/02/2018 Procedure   On 03/02/18 EGD performed for dysphagia, esophageal discomfort and abdominal pain and showed mild stricture at EG junction, dilation was performed. Otherwise it was a normal EGD.  FINAL PATHOLOGIC DIAGNOSIS ESOPHAGUS, ENDOSCOPIC BIOPSY:      MILD CHRONIC INFLAMMATION.      NO EVIDENCE OF EOSINOPHILIC ESOPHAGITIS.   03/03/2018 Imaging   MRI AP 03/03/18  [facility]  IMPRESSION: Previous gastrectomy. No evidence of recurrent or metastatic disease within the abdomen or pelvis.  Interval resolution of left ovarian hemorrhagic cyst.   06/07/2018 Imaging   06/07/2018 CT Chest Abdomen and Pelvis [facility] IMPRESSION: 1. No evidence of local tumor recurrence status post distal gastrectomy. 2. No findings suspicious for metastatic disease in the chest, abdomen or pelvis. 3. Previously questioned left para-aortic adenopathy represents a benign venous variant in this patient with a circumaortic left renal vein. 4. Previously described tiny left pulmonary nodules are stable since 07/28/2017 CT and are probably benign. 5. Aortic Atherosclerosis (ICD10-I70.0).   06/11/2018 Mammogram   Diagnostic Mammogram 06/11/18  FINDINGS:  Unchanged group of punctate calcifications in the retroareolar right breast, anterior third.  Unchanged oval equal density mass with circumscribed margins in the outer central aspect of the left breast, middle third. No new suspicious mass, distortion or calcifications in either breast.   10/04/2018 Cancer Staging   Staging form: Stomach, AJCC 8th Edition - Pathologic stage from 10/04/2018: Stage IA (pT1a, pN0, cM0) - Signed by Truitt Merle, MD on 10/04/2018      CURRENT THERAPY:  1. Cancer surveillance 2. IV Venofer as needed, received in 02/2019 and 09/2019 and 05/11/2020 3. Monthly B12 injections at home  INTERVAL HISTORY:  Ellason Doucett is here for a follow up of gastric cancer and iron deficiency. She was last seen by me on 05/04/20. She presents to the clinic alone. She is doing well overall.  Intermittent pain in tail bone since low back surgery one year ago, she f/u with her surgeon and had CT lumbar spine in June 2022.  She also underwent colonoscopy and EGD in late April 2022, which was unremarkable.  Last CT abdomen pelvis was in early April 2022. Her B12 level was low a few months ago, she increased injection to weekly for months, now on monthly injection now. She still has mild to moderate fatigue, but able to function well at home.  She is eating well, weight is stable.  Occasional gastric upset, no persistent pain or other GI symptoms.   All other systems were reviewed with the patient and are negative.  MEDICAL HISTORY:  Past Medical History:  Diagnosis Date   Cancer [facility]) 2019   Gastric  Stage 1A   Colitis     SURGICAL HISTORY: Past Surgical History:  Procedure Laterality Date   breast surgery     GASTRECTOMY  2019    I have reviewed the social history and family history with the patient and they are unchanged from previous note.  ALLERGIES:  is allergic to melatonin, tape, and zolpidem.  MEDICATIONS:  Current Outpatient Medications  Medication Sig Dispense Refill   baclofen (LIORESAL) 10 MG tablet Take 10 mg by mouth 3 (three) times daily as needed.      baclofen (LIORESAL) 10 MG tablet TAKE 1 TABLET BY MOUTH THREE TIMES DAILY AS NEEDED 60 tablet 2   bisacodyl (DULCOLAX) 10 MG suppository Place 10 mg rectally daily as needed for mild constipation or moderate constipation.      celecoxib (CELEBREX) 50 MG capsule Take 50 mg by mouth daily.     cyanocobalamin (,VITAMIN B-12,) 1000 MCG/ML injection INJECT 1 ML INTO THE MUSCLE EVERY 30 DAYS 1 mL 5   cyanocobalamin (,VITAMIN B-12,) 1000 MCG/ML injection Inject 1 ml IM once monthly 12 mL 0   ergocalciferol (VITAMIN D2) 1.25 MG (50000 UT) capsule Take 1 capsule (50,000 Units total) by mouth once a week. 8 capsule 0   HYDROcodone-acetaminophen (NORCO/VICODIN) 5-325 MG tablet Take 1 tablet by mouth every 8 hours as needed for pain 24 tablet 0   hydrocortisone (ANUSOL-HC) 25 MG suppository Place 25 mg rectally daily as needed.     mirtazapine (REMERON) 15 MG tablet TAKE 1 TABLET BY MOUTH NIGHTLY 30 tablet 0   mirtazapine (REMERON) 15 MG tablet Take 1 tablet by mouth nightly 30 tablet 11   ondansetron (ZOFRAN) 8 MG tablet TAKE 1 TABLET BY MOUTH EVERY 12 HOURS AS NEEDED FOR NAUSEA 20 tablet 3   ondansetron (ZOFRAN) 8 MG tablet TAKE 1 TABLET BY MOUTH EVERY 12 HOURS AS NEEDED FOR NAUSEA 20 tablet 3   ondansetron (ZOFRAN-ODT) 4 MG disintegrating tablet Take 4 mg by mouth every 8 (eight) hours as needed for nausea or vomiting.  (Patient not taking: Reported on 09/23/2019)     pantoprazole (PROTONIX) 40 MG tablet Take 40 mg by mouth daily.     pantoprazole (PROTONIX) 40 MG tablet Take 1 tablet by mouth once a day 90 tablet 1   PARoxetine (PAXIL) 20 MG tablet Take 20 mg by mouth daily.     PARoxetine (PAXIL) 30 MG tablet TAKE 1 TABLET BY MOUTH ONCE DAILY 90 tablet 1   polyethylene glycol (MIRALAX / GLYCOLAX) 17 g packet Take 17 g by mouth 2 (two) times a day.     pregabalin (LYRICA) 25 MG capsule Take 2 capsules (50 mg total) by mouth 2 (two) times daily. 120 capsule 4   pregabalin (LYRICA) 50 MG capsule Take 50 mg by  mouth 2 (two) times daily.     pregabalin (LYRICA) 50 MG capsule Take 50 mg by mouth 3 (three) times daily.     sertraline (ZOLOFT) 25 MG tablet TAKE 1 TABLET BY MOUTH DAILY 90 tablet 1   sertraline (ZOLOFT) 50 MG tablet Take 1 tablet (50 mg total) by mouth daily. 90 tablet 1   Syringe/Needle, Disp, (SYRINGE 3CC/23GX1") 23G X 1" 3 ML MISC 1 Syringe by Does not apply route every 30 (thirty) days. 1 each 5   temazepam (RESTORIL) 15 MG capsule Take 1 capsule (15 mg total) by mouth at bedtime. 30 capsule 0   temazepam (RESTORIL) 15 MG capsule TAKE 1 CAPSULE BY MOUTH NIGHTLY AS NEEDED  FOR SLEEP 30 capsule 3   temazepam (RESTORIL) 15 MG capsule TAKE 1 CAPSULE BY MOUTH EVERY NIGHT AT BEDTIME AS NEEDED FOR SLEEP 30 capsule 3   temazepam (RESTORIL) 15 MG capsule TAKE 1 CAPSULE BY MOUTH NIGHTLY AS NEEDED FOR SLEEP 30 capsule 3   temazepam (RESTORIL) 15 MG capsule TAKE 1 CAPSULE BY MOUTH NIGHTLY AS NEEDED FOR SLEEP 30 capsule 3   No current facility-administered medications for this visit.    PHYSICAL EXAMINATION: ECOG PERFORMANCE STATUS: 1 - Symptomatic but completely ambulatory  Vitals:   11/02/20 1544  BP: 138/79  Pulse: 78  Resp: 16  Temp: 98.1 F (36.7 C)  SpO2: 100%   Wt Readings from Last 3 Encounters:  11/02/20 122 lb 9.6 oz (55.6 kg)  05/04/20 123 lb 8 oz (56 kg)  12/16/19 127 lb 12.8 oz (58 kg)     GENERAL:alert, no distress and comfortable SKIN: skin color, texture, turgor are normal, no rashes or significant lesions EYES: normal, Conjunctiva are pink and non-injected, sclera clear NECK: supple, thyroid normal size, non-tender, without nodularity LYMPH:  no palpable lymphadenopathy in the cervical, axillary  LUNGS: clear to auscultation and percussion with normal breathing effort HEART: regular rate & rhythm and no murmurs and no lower extremity edema ABDOMEN:abdomen soft, non-tender and normal bowel sounds Musculoskeletal:no cyanosis of digits and no clubbing  NEURO: alert &  oriented x 3 with fluent speech, no focal motor/sensory deficits  LABORATORY DATA:  I have reviewed the data as listed CBC Latest Ref Rng & Units 11/02/2020 09/20/2020 05/04/2020  WBC 4.0 - 10.5 K/uL 8.3 7.8 9.7  Hemoglobin 12.0 - 15.0 g/dL 12.9 13.0 13.6  Hematocrit 36.0 - 46.0 % 39.3 39.7 42.4  Platelets 150 - 400 K/uL 743(H) 737(H) 753(H)     CMP Latest Ref Rng & Units 11/02/2020 09/20/2020 05/04/2020  Glucose 70 - 99 mg/dL 125(H) 108(H) 103(H)  BUN 6 - 20 mg/dL '13 13 13  '$ Creatinine 0.44 - 1.00 mg/dL 0.96 0.75 0.79  Sodium 135 - 145 mmol/L 140 136 137  Potassium 3.5 - 5.1 mmol/L 4.0 4.2 4.5  Chloride 98 - 111 mmol/L 105 99 104  CO2 22 - 32 mmol/L '25 31 28  '$ Calcium 8.9 - 10.3 mg/dL 9.0 9.6 9.1  Total Protein 6.5 - 8.1 g/dL 7.2 7.0 7.4  Total Bilirubin 0.3 - 1.2 mg/dL 0.2(L) 0.3 0.2(L)  Alkaline Phos 38 - 126 U/L 118 129(H) 100  AST 15 - 41 U/L '27 19 17  '$ ALT 0 - 44 U/L '27 21 12      '$ RADIOGRAPHIC STUDIES: I have personally reviewed the radiological images as listed and agreed with the findings in the report. No results found.   ASSESSMENT & PLAN:  Brooke Austin is a 51 y.o. female with   1. Iron deficiency and B12 deficiency anemia -Secondary to poor absorption from gastrectomy and subsequent intussusception reduction. 09/2019 labs showed Hg 13.1, Ferritin 40, serum iron 135, TIBC 335, 40% transferrin saturation, and UIBC 200 -Menorrhagia may also contribute to IDA. She has had repeated EGD and colonoscopy, last in 06/2020. She denies obvious GI bleeding unless she strains with constipation.  -Continue home B12 injection, she is on monthly now, today's B12 level is pending -Continue IV iron as needed.  2.  Thrombocytosis -intermittent since 2019, highest was 1102K in 08/2017, more persistent and trending up lately -Her thrombocytosis is likely reactive, probable due to IDA  -She previously did not tolerate oral iron, and received IV Venofer after  gastrectomy in 02/2019.  Restarted in 09/2019 as needed, but has not taking regularly. Most recent IV venofer 05/11/20. -she has persistent thrombocytosis even iron level normalized -I will check JAK2 panel (including MPL, CALR with reflex MPN panel) to rule out ET although the probability is not high given the intermittent course and young age    73. Gastric Cancer, Stage I, pT1aN0M0 -Diagnosed in 07/2017. S/p total gastrectomy due to her strong family history of gastric cancer.  -Her cancer was very early stage and, s/p complete resection and her risk for recurrence is low.  -Most recent colonoscopy/EGD 07/20/20 with [facility] showed a widely patent Schatzki's ring in distal esophagus, otherwise unremarkable and no evidence of recurrence. -last CT AP in 06/2020 also showed NED  -Lab reviewed, exam was unremarkable, no concern for recurrence.   3. Weight Management  -After gastrectomy she became malnourished with severe weight loss.  -After 4 month of TPN completed in 04/2019 she has gained adequate weight, now 120-130. Stable.   4. Constipation -Colonoscopy and Exam with Dr Juleen China on 04/15/19 showed colitis and possible IBS as the cause of her constipation. Patient also notes dumping syndrome and mild rectal bleeding with hard constipation.  -continue laxative as needed    5. Chronic sciatica low back pain  -She sees orthopedic surgeon, Dr. Junius Roads -Given severe spinal stenosis and back pain, she underwent back surgery on 01/04/20.  -She still has intermittent back pain after surgery, she will f/u    6. Insomnia, Depression/Anxiety -Has been on Paxil, Mood stable.    7. Right breast calcifications and left breast mass -As seen on her 12/10/17 Mammogram and stable on 06/11/18 mammogram.  -suspicious areas on 06/27/20 MRI. When she returned for biopsies on 07/10/20, there was no sonographic correlate. -continue breast cancer screening    8. Genetic testing was negative for pathogenetic mutations.      PLAN: -lab  reviewed, no anemia, plt 743K  -will schedule lab in the next few weeks for MPN panel  -B12 284, will increase B12 injection from monthly to every twice a month  -iron level pending  -Lab and F/u in 6 months    No problem-specific Assessment & Plan notes found for this encounter.   Orders Placed This Encounter  Procedures   JAK2 (INCLUDING V617F AND EXON 12), MPL,& CALR W/RFL MPN PANEL (NGS)    Standing Status:   Future    Standing Expiration Date:   11/02/2021    All questions were answered. The patient knows to call the clinic with any problems, questions or concerns. No barriers to learning was detected. The total time spent in the appointment was 30 minutes.     Truitt Merle, MD 11/03/2020   I, Wilburn Mylar, am acting as scribe for Truitt Merle, MD.   I have reviewed the above documentation for accuracy and completeness, and I agree with the above.

## 2020-11-03 ENCOUNTER — Encounter: Payer: Self-pay | Admitting: Hematology

## 2020-11-05 ENCOUNTER — Other Ambulatory Visit: Payer: Self-pay

## 2020-11-05 DIAGNOSIS — D508 Other iron deficiency anemias: Secondary | ICD-10-CM

## 2020-11-05 LAB — IRON AND TIBC
Iron: 73 ug/dL (ref 41–142)
Saturation Ratios: 25 % (ref 21–57)
TIBC: 290 ug/dL (ref 236–444)
UIBC: 217 ug/dL (ref 120–384)

## 2020-11-05 LAB — FERRITIN: Ferritin: 112 ng/mL (ref 11–307)

## 2020-11-07 ENCOUNTER — Inpatient Hospital Stay: Payer: Federal, State, Local not specified - PPO

## 2020-11-07 ENCOUNTER — Other Ambulatory Visit: Payer: Self-pay

## 2020-11-07 DIAGNOSIS — D75839 Thrombocytosis, unspecified: Secondary | ICD-10-CM

## 2020-11-14 LAB — JAK2 (INCLUDING V617F AND EXON 12), MPL,& CALR W/RFL MPN PANEL (NGS)

## 2020-12-06 ENCOUNTER — Encounter: Payer: Self-pay | Admitting: Hematology

## 2021-01-31 ENCOUNTER — Telehealth: Payer: Self-pay

## 2021-01-31 ENCOUNTER — Encounter: Payer: Self-pay | Admitting: Hematology

## 2021-01-31 NOTE — Telephone Encounter (Signed)
Per pt request documents faxed over to [employer].

## 2021-05-03 ENCOUNTER — Encounter: Payer: Self-pay | Admitting: Hematology

## 2021-05-03 ENCOUNTER — Other Ambulatory Visit: Payer: Self-pay

## 2021-05-03 ENCOUNTER — Inpatient Hospital Stay: Payer: Federal, State, Local not specified - PPO

## 2021-05-03 ENCOUNTER — Inpatient Hospital Stay: Payer: Federal, State, Local not specified - PPO | Attending: Hematology | Admitting: Hematology

## 2021-05-03 VITALS — BP 106/64 | HR 64 | Resp 18 | Wt 128.6 lb

## 2021-05-03 DIAGNOSIS — E538 Deficiency of other specified B group vitamins: Secondary | ICD-10-CM | POA: Diagnosis not present

## 2021-05-03 DIAGNOSIS — D508 Other iron deficiency anemias: Secondary | ICD-10-CM | POA: Diagnosis not present

## 2021-05-03 DIAGNOSIS — Z903 Acquired absence of stomach [part of]: Secondary | ICD-10-CM | POA: Insufficient documentation

## 2021-05-03 DIAGNOSIS — E611 Iron deficiency: Secondary | ICD-10-CM | POA: Diagnosis not present

## 2021-05-03 DIAGNOSIS — D75839 Thrombocytosis, unspecified: Secondary | ICD-10-CM | POA: Diagnosis not present

## 2021-05-03 DIAGNOSIS — K912 Postsurgical malabsorption, not elsewhere classified: Secondary | ICD-10-CM | POA: Diagnosis not present

## 2021-05-03 DIAGNOSIS — D519 Vitamin B12 deficiency anemia, unspecified: Secondary | ICD-10-CM | POA: Insufficient documentation

## 2021-05-03 DIAGNOSIS — F32A Depression, unspecified: Secondary | ICD-10-CM | POA: Insufficient documentation

## 2021-05-03 DIAGNOSIS — N920 Excessive and frequent menstruation with regular cycle: Secondary | ICD-10-CM | POA: Diagnosis not present

## 2021-05-03 DIAGNOSIS — M5440 Lumbago with sciatica, unspecified side: Secondary | ICD-10-CM | POA: Insufficient documentation

## 2021-05-03 DIAGNOSIS — G4700 Insomnia, unspecified: Secondary | ICD-10-CM | POA: Diagnosis not present

## 2021-05-03 DIAGNOSIS — F419 Anxiety disorder, unspecified: Secondary | ICD-10-CM | POA: Insufficient documentation

## 2021-05-03 DIAGNOSIS — Z79899 Other long term (current) drug therapy: Secondary | ICD-10-CM | POA: Diagnosis not present

## 2021-05-03 DIAGNOSIS — D75838 Other thrombocytosis: Secondary | ICD-10-CM | POA: Diagnosis not present

## 2021-05-03 DIAGNOSIS — Z85028 Personal history of other malignant neoplasm of stomach: Secondary | ICD-10-CM | POA: Insufficient documentation

## 2021-05-03 DIAGNOSIS — C161 Malignant neoplasm of fundus of stomach: Secondary | ICD-10-CM

## 2021-05-03 LAB — CBC WITH DIFFERENTIAL/PLATELET
Abs Immature Granulocytes: 0.01 10*3/uL (ref 0.00–0.07)
Basophils Absolute: 0.1 10*3/uL (ref 0.0–0.1)
Basophils Relative: 1 %
Eosinophils Absolute: 0.5 10*3/uL (ref 0.0–0.5)
Eosinophils Relative: 7 %
HCT: 38.1 % (ref 36.0–46.0)
Hemoglobin: 12.4 g/dL (ref 12.0–15.0)
Immature Granulocytes: 0 %
Lymphocytes Relative: 30 %
Lymphs Abs: 2 10*3/uL (ref 0.7–4.0)
MCH: 30 pg (ref 26.0–34.0)
MCHC: 32.5 g/dL (ref 30.0–36.0)
MCV: 92 fL (ref 80.0–100.0)
Monocytes Absolute: 0.4 10*3/uL (ref 0.1–1.0)
Monocytes Relative: 6 %
Neutro Abs: 3.7 10*3/uL (ref 1.7–7.7)
Neutrophils Relative %: 56 %
Platelets: 612 10*3/uL — ABNORMAL HIGH (ref 150–400)
RBC: 4.14 MIL/uL (ref 3.87–5.11)
RDW: 13.2 % (ref 11.5–15.5)
WBC: 6.7 10*3/uL (ref 4.0–10.5)
nRBC: 0 % (ref 0.0–0.2)

## 2021-05-03 LAB — CMP (CANCER CENTER ONLY)
ALT: 25 U/L (ref 0–44)
AST: 27 U/L (ref 15–41)
Albumin: 4.1 g/dL (ref 3.5–5.0)
Alkaline Phosphatase: 120 U/L (ref 38–126)
Anion gap: 4 — ABNORMAL LOW (ref 5–15)
BUN: 9 mg/dL (ref 6–20)
CO2: 28 mmol/L (ref 22–32)
Calcium: 9.4 mg/dL (ref 8.9–10.3)
Chloride: 107 mmol/L (ref 98–111)
Creatinine: 0.72 mg/dL (ref 0.44–1.00)
GFR, Estimated: >60 mL/min (ref >60)
Glucose, Bld: 139 mg/dL — ABNORMAL HIGH (ref 70–99)
Potassium: 3.8 mmol/L (ref 3.5–5.1)
Sodium: 139 mmol/L (ref 135–145)
Total Bilirubin: 0.2 mg/dL — ABNORMAL LOW (ref 0.3–1.2)
Total Protein: 6.8 g/dL (ref 6.5–8.1)

## 2021-05-03 LAB — CEA (IN HOUSE-CHCC): CEA (CHCC-In House): 4.79 ng/mL (ref 0.00–5.00)

## 2021-05-03 LAB — IRON AND IRON BINDING CAPACITY (CC-WL,HP ONLY)
Iron: 70 ug/dL (ref 28–170)
Saturation Ratios: 21 % (ref 10.4–31.8)
TIBC: 328 ug/dL (ref 250–450)
UIBC: 258 ug/dL (ref 148–442)

## 2021-05-03 LAB — FERRITIN: Ferritin: 68 ng/mL (ref 11–307)

## 2021-05-03 NOTE — Progress Notes (Signed)
Brooke Austin   [phone] [phone]   Clinic Follow up Note   Patient Care Team: Haydee Salter, MD as PCP - General (Internal Medicine)  Date of Service:  05/03/2021  CHIEF COMPLAINT: f/u of gastric cancer and iron deficiency  CURRENT THERAPY:  1. Cancer surveillance 2. IV Venofer as needed, last in 04/2020 3. Monthly B12 injections at home  ASSESSMENT & PLAN:  Brooke Austin is a 51 y.o. female with   1. Iron deficiency and B12 deficiency anemia -Secondary to poor absorption from gastrectomy and subsequent intussusception reduction. 09/2019 labs showed Hg 13.1, Ferritin 40, serum iron 135, TIBC 335, 40% transferrin saturation, and UIBC 200 -Menorrhagia may also contribute to IDA. She has had repeated EGD and colonoscopy, last in 06/2020.  -iron panel and ferritin from today are pending. CBC was not obtained, so we will send her back to lab today. -Continue home B12 injection, she is on monthly now -Continue IV iron as needed.   2.  Thrombocytosis -intermittent since 2019, highest was 1102K in 08/2017 -Her thrombocytosis is likely reactive, probable due to IDA  -She previously did not tolerate oral iron, and received IV Venofer after gastrectomy in 02/2019. Restarted in 09/2019 as needed, but has not taking regularly. Most recent IV venofer 05/11/20. -she has persistent thrombocytosis even iron level normalized -JAK2 panel on 11/07/20 was negative.   3. Gastric Cancer, Stage I, [id number] -Diagnosed in 07/2017. S/p total gastrectomy due to her strong family history of gastric cancer.  -Her cancer was very early stage and, s/p complete resection and her risk for recurrence is low.  -Most recent colonoscopy/EGD 07/20/20 with [facility] showed a widely patent Schatzki's ring in distal esophagus, otherwise unremarkable and no evidence of recurrence. -last CT AP in 06/2020 also showed NED  -Lab reviewed, exam showed some mild tenderness but otherwise was  unremarkable, no concern for recurrence.   4. Weight Management  -After gastrectomy she became malnourished with severe weight loss.  -After 4 month of TPN completed in 04/2019 she has gained adequate weight, now 120-130. Stable.   5. Chronic sciatica low back pain  -She sees orthopedic surgeon, Dr. Junius Roads -Given severe spinal stenosis and back pain, she underwent back surgery on 01/04/20.  -She still has intermittent back pain after surgery, she will f/u    6. Insomnia, Depression/Anxiety -Has been on Paxil, Mood stable.    7. Genetic testing was negative for pathogenetic mutations.      PLAN: -lab and f.u in 1 year   No problem-specific Assessment & Plan notes found for this encounter.   SUMMARY OF ONCOLOGIC HISTORY: Oncology History Overview Note  Cancer Staging Malignant neoplasm of stomach [facility]) Staging form: Stomach, AJCC 8th Edition - Pathologic stage from 10/04/2018: Stage IA (pT1a, pN0, cM0) - Signed by Truitt Merle, MD on 10/04/2018    Malignant neoplasm of stomach ([place])  07/23/2017 Initial Biopsy   07/23/17 EGD revealed malignant gastric ulcer. 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   07/27/2017 Initial Diagnosis   Malignant neoplasm of stomach ([place])   07/28/2017 Imaging   CT CAP W Contrast 07/28/17 [facility] IMPRESSION: 1. The primary gastric lesion is not readily apparent on this study. 2. Borderline enlarged gastrohepatic ligament node measures 1 cm. Additionally there is a ovoid soft tissue attenuating nodule within the left periaortic retroperitoneum which is indeterminate. Periaortic adenopathy cannot be excluded. Consider further evaluation with PET-CT. 3. Tiny nonspecific nodule in the  left upper lobe measures 4 mm.   07/30/2017 PET scan   PET 07/30/17 [facility] IMPRESSION: 1. No hypermetabolic mass or adenopathy to suggest metastatic disease. 2. Low level FDG uptake associated with the  periaortic left retroperitoneal nodule compatible with a benign etiology. 3. 4 mm left upper lobe lung nodule is too small to characterize by PET-CT.   08/19/2017 Surgery   Total Gastrectomy 08/19/2017 at [facility]   08/19/2017 Pathology Results   FINAL PATHOLOGIC DIAGNOSIS from [facility] MICROSCOPIC EXAMINATION AND DIAGNOSIS  A.  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 The tumor invades into the lamina propria. Angiolymphatic invasion is not identified. Perineural invasion is not identified.       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       Glandular mucosa with intestinal metaplasia in the distal esophagus consistent with specialized Barrett's mucosa; negative for dysplasia.       Multiple (49) perigastric lymph nodes are negative for tumor (0/49).       Squamous esophageal mucosa at the proximal surgical margin; negative for tumor.       Duodenal mucosa at the distal surgical margin; negative for tumor,       All surgical margins are negative for tumor (minimum tumor free margin, 7.0 cm, distal surgical margin).  B.  LYMPH NODES, HEPATIC ARTERY, EXCISION:      Multiple (8) hepatic artery lymph nodes are negative for tumor (0/8).  C.  LYMPH NODES, SPLENIC ARTERY, EXCISION:      Multiple (2) splenic artery lymph nodes are negative for    12/07/2017 Imaging   CT CAP W Constrast 12/07/17 [facility] IMPRESSION: 1. Status post gastrectomy, without residual or recurrent disease. 2. Left periaortic adenopathy, felt to be similar to the prior exam. Not FDG avid on interval PE T. Favored to be reactive. Recommend attention on follow-up. 3.  Aortic Atherosclerosis (ICD10-I70.0).  This is age advanced. 4. Tiny bilateral pulmonary nodules are unchanged, favoring a benign etiology.   12/10/2017 Mammogram   Diagnostic Mammogram  12/10/17 [facility] FINDINGS:  Mammographically, there is a group of punctate calcifications in the retroareolar right breast anterior third. Oval mass with circumscribed margins in the outer central aspect of the left breast middle third. On physical examination, there is no palpable abnormality in the left breast at the area of mammographic concern. On targeted ultrasound, there is no sonographic correlate for the mammographic finding. Incidental intramammary lymph node measuring 8 x 6 x 2 mm at the 3:00 position of the left breast 4 cm from the nipple.   02/26/2018 Imaging   CT AP W contrast 02/26/18 [facility] IMPRESSION: 1. Interval improvement or near complete resolution of the previously seen colitis involving the transverse colon. 2. Mildly thickened segment of small bowel in the left hemipelvis concerning for enteritis. Clinical correlation is recommended. No bowel obstruction. Normal appendix. 3. Large amount of stool throughout the colon. 4. A 3.5 cm left ovarian complex/hemorrhagic cyst. Ultrasound may provide better evaluation of the pelvic structures. Probable right ovarian corpus luteum.    03/02/2018 Procedure   On 03/02/18 EGD performed for dysphagia, esophageal discomfort and abdominal pain and showed mild stricture at EG junction, dilation was performed. Otherwise it was a normal EGD.  FINAL PATHOLOGIC DIAGNOSIS ESOPHAGUS, ENDOSCOPIC BIOPSY:      MILD CHRONIC INFLAMMATION.      NO EVIDENCE OF EOSINOPHILIC ESOPHAGITIS.  03/03/2018 Imaging   MRI AP 03/03/18  [facility]  IMPRESSION: Previous gastrectomy. No evidence of recurrent or metastatic disease within the abdomen or pelvis.  Interval resolution of left ovarian hemorrhagic cyst.   06/07/2018 Imaging   06/07/2018 CT Chest Abdomen and Pelvis [facility] IMPRESSION: 1. No evidence of local tumor recurrence status post distal gastrectomy. 2. No findings suspicious for metastatic disease in the chest, abdomen or pelvis. 3. Previously  questioned left para-aortic adenopathy represents a benign venous variant in this patient with a circumaortic left renal vein. 4. Previously described tiny left pulmonary nodules are stable since 07/28/2017 CT and are probably benign. 5. Aortic Atherosclerosis (ICD10-I70.0).   06/11/2018 Mammogram   Diagnostic Mammogram 06/11/18  FINDINGS:  Unchanged group of punctate calcifications in the retroareolar right breast, anterior third. Unchanged oval equal density mass with circumscribed margins in the outer central aspect of the left breast, middle third. No new suspicious mass, distortion or calcifications in either breast.   10/04/2018 Cancer Staging   Staging form: Stomach, AJCC 8th Edition - Pathologic stage from 10/04/2018: Stage IA (pT1a, pN0, cM0) - Signed by Truitt Merle, MD on 10/04/2018       INTERVAL HISTORY:  Brooke Austin is here for a follow up of gastric cancer and iron deficiency. She was last seen by me on 11/02/20. She presents to the clinic alone. She reports she is doing well overall. She notes she is having less periods now. She also notes she is now on a medication for osteoporosis.   All other systems were reviewed with the patient and are negative.  MEDICAL HISTORY:  Past Medical History:  Diagnosis Date   Cancer ([place]) 2019   Gastric Stage 1A   Colitis     SURGICAL HISTORY: Past Surgical History:  Procedure Laterality Date   breast surgery     GASTRECTOMY  2019    I have reviewed the social history and family history with the patient and they are unchanged from previous note.  ALLERGIES:  is allergic to melatonin, tape, and zolpidem.  MEDICATIONS:  Current Outpatient Medications  Medication Sig Dispense Refill   baclofen (LIORESAL) 10 MG tablet Take 10 mg by mouth 3 (three) times daily as needed.     baclofen (LIORESAL) 10 MG tablet TAKE 1 TABLET BY MOUTH THREE TIMES DAILY AS NEEDED 60 tablet 2   bisacodyl (DULCOLAX) 10 MG suppository Place 10 mg  rectally daily as needed for mild constipation or moderate constipation.      celecoxib (CELEBREX) 50 MG capsule Take 50 mg by mouth daily.     cyanocobalamin (,VITAMIN B-12,) 1000 MCG/ML injection INJECT 1 ML INTO THE MUSCLE EVERY 30 DAYS 1 mL 5   cyanocobalamin (,VITAMIN B-12,) 1000 MCG/ML injection Inject 1 ml IM once monthly 12 mL 0   ergocalciferol (VITAMIN D2) 1.25 MG (50000 UT) capsule Take 1 capsule (50,000 Units total) by mouth once a week. 8 capsule 0   HYDROcodone-acetaminophen (NORCO/VICODIN) 5-325 MG tablet Take 1 tablet by mouth every 8 hours as needed for pain 24 tablet 0   hydrocortisone (ANUSOL-HC) 25 MG suppository Place 25 mg rectally daily as needed.     mirtazapine (REMERON) 15 MG tablet TAKE 1 TABLET BY MOUTH NIGHTLY 30 tablet 0   mirtazapine (REMERON) 15 MG tablet Take 1 tablet by mouth nightly 30 tablet 11   ondansetron (ZOFRAN) 8 MG tablet TAKE 1 TABLET BY MOUTH EVERY 12 HOURS AS NEEDED FOR NAUSEA 20 tablet 3   ondansetron (ZOFRAN) 8 MG  tablet TAKE 1 TABLET BY MOUTH EVERY 12 HOURS AS NEEDED FOR NAUSEA 20 tablet 3   ondansetron (ZOFRAN-ODT) 4 MG disintegrating tablet Take 4 mg by mouth every 8 (eight) hours as needed for nausea or vomiting.  (Patient not taking: Reported on 09/23/2019)     pantoprazole (PROTONIX) 40 MG tablet Take 40 mg by mouth daily.     pantoprazole (PROTONIX) 40 MG tablet Take 1 tablet by mouth once a day 90 tablet 1   PARoxetine (PAXIL) 20 MG tablet Take 20 mg by mouth daily.     PARoxetine (PAXIL) 30 MG tablet TAKE 1 TABLET BY MOUTH ONCE DAILY 90 tablet 1   polyethylene glycol (MIRALAX / GLYCOLAX) 17 g packet Take 17 g by mouth 2 (two) times a day.     pregabalin (LYRICA) 25 MG capsule Take 2 capsules (50 mg total) by mouth 2 (two) times daily. 120 capsule 4   pregabalin (LYRICA) 50 MG capsule Take 50 mg by mouth 2 (two) times daily.     pregabalin (LYRICA) 50 MG capsule Take 50 mg by mouth 3 (three) times daily.     sertraline (ZOLOFT) 25 MG tablet  TAKE 1 TABLET BY MOUTH DAILY 90 tablet 1   sertraline (ZOLOFT) 50 MG tablet Take 1 tablet (50 mg total) by mouth daily. 90 tablet 1   Syringe/Needle, Disp, (SYRINGE 3CC/23GX1") 23G X 1" 3 ML MISC 1 Syringe by Does not apply route every 30 (thirty) days. 1 each 5   temazepam (RESTORIL) 15 MG capsule Take 1 capsule (15 mg total) by mouth at bedtime. 30 capsule 0   temazepam (RESTORIL) 15 MG capsule TAKE 1 CAPSULE BY MOUTH NIGHTLY AS NEEDED FOR SLEEP 30 capsule 3   temazepam (RESTORIL) 15 MG capsule TAKE 1 CAPSULE BY MOUTH EVERY NIGHT AT BEDTIME AS NEEDED FOR SLEEP 30 capsule 3   temazepam (RESTORIL) 15 MG capsule TAKE 1 CAPSULE BY MOUTH NIGHTLY AS NEEDED FOR SLEEP 30 capsule 3   temazepam (RESTORIL) 15 MG capsule TAKE 1 CAPSULE BY MOUTH NIGHTLY AS NEEDED FOR SLEEP 30 capsule 3   No current facility-administered medications for this visit.    PHYSICAL EXAMINATION: ECOG PERFORMANCE STATUS: 1 - Symptomatic but completely ambulatory  Vitals:   05/03/21 1524  BP: 106/64  Pulse: 64  Resp: 18  SpO2: 98%   Wt Readings from Last 3 Encounters:  05/03/21 128 lb 9 oz (58.3 kg)  11/02/20 122 lb 9.6 oz (55.6 kg)  05/04/20 123 lb 8 oz (56 kg)     GENERAL:alert, no distress and comfortable SKIN: skin color, texture, turgor are normal, no rashes or significant lesions EYES: normal, Conjunctiva are pink and non-injected, sclera clear  LUNGS: clear to auscultation and percussion with normal breathing effort HEART: regular rate & rhythm and no murmurs and no lower extremity edema ABDOMEN:abdomen soft, (+) very mild tenderness Musculoskeletal:no cyanosis of digits and no clubbing  NEURO: alert & oriented x 3 with fluent speech, no focal motor/sensory deficits  LABORATORY DATA:  I have reviewed the data as listed CBC Latest Ref Rng & Units 05/03/2021 11/02/2020 09/20/2020  WBC 4.0 - 10.5 K/uL 6.7 8.3 7.8  Hemoglobin 12.0 - 15.0 g/dL 12.4 12.9 13.0  Hematocrit 36.0 - 46.0 % 38.1 39.3 39.7  Platelets  150 - 400 K/uL 612(H) 743(H) 737(H)     CMP Latest Ref Rng & Units 05/03/2021 11/02/2020 09/20/2020  Glucose 70 - 99 mg/dL 139(H) 125(H) 108(H)  BUN 6 - 20 mg/dL 9 13 13  Creatinine 0.44 - 1.00 mg/dL 0.72 0.96 0.75  Sodium 135 - 145 mmol/L 139 140 136  Potassium 3.5 - 5.1 mmol/L 3.8 4.0 4.2  Chloride 98 - 111 mmol/L 107 105 99  CO2 22 - 32 mmol/L 28 25 31   Calcium 8.9 - 10.3 mg/dL 9.4 9.0 9.6  Total Protein 6.5 - 8.1 g/dL 6.8 7.2 7.0  Total Bilirubin 0.3 - 1.2 mg/dL 0.2(L) 0.2(L) 0.3  Alkaline Phos 38 - 126 U/L 120 118 129(H)  AST 15 - 41 U/L 27 27 19   ALT 0 - 44 U/L 25 27 21       RADIOGRAPHIC STUDIES: I have personally reviewed the radiological images as listed and agreed with the findings in the report. No results found.    Orders Placed This Encounter  Procedures   CBC with Differential/Platelet    Standing Status:   Standing    Number of Occurrences:   50    Standing Expiration Date:   05/03/2022   All questions were answered. The patient knows to call the clinic with any problems, questions or concerns. No barriers to learning was detected. The total time spent in the appointment was 25 minutes.     Truitt Merle, MD 05/03/2021   I, Wilburn Mylar, am acting as scribe for Truitt Merle, MD.   I have reviewed the above documentation for accuracy and completeness, and I agree with the above.

## 2021-05-04 ENCOUNTER — Encounter: Payer: Self-pay | Admitting: Hematology

## 2021-05-07 LAB — METHYLMALONIC ACID, SERUM: Methylmalonic Acid, Quantitative: 161 nmol/L (ref 0–378)

## 2022-05-04 NOTE — Assessment & Plan Note (Signed)
Secondary to poor absorption from gastrectomy and subsequent intussusception reduction. 09/2019 labs showed Hg 13.1, Ferritin 40, serum iron 135, TIBC 335, 40% transferrin saturation, and UIBC 200 -Menorrhagia may also contribute to IDA. She has had repeated EGD and colonoscopy, last in 06/2020.  -iron panel and ferritin from today are pending. CBC was not obtained, so we will send her back to lab today. -Continue home B12 injection, she is on monthly now -Continue IV iron as needed.

## 2022-05-04 NOTE — Assessment & Plan Note (Signed)
-  Diagnosed in 07/2017. S/p total gastrectomy due to her strong family history of gastric cancer.  -Her cancer was very early stage and, s/p complete resection and her risk for recurrence is low.  -Most recent colonoscopy/EGD 07/20/20 with [facility] showed a widely patent Schatzki's ring in distal esophagus, otherwise unremarkable and no evidence of recurrence. -last CT AP in 06/2020 also showed NED

## 2022-05-05 ENCOUNTER — Encounter: Payer: Self-pay | Admitting: Hematology

## 2022-05-05 ENCOUNTER — Inpatient Hospital Stay: Payer: Federal, State, Local not specified - PPO | Attending: Hematology

## 2022-05-05 ENCOUNTER — Other Ambulatory Visit: Payer: Self-pay

## 2022-05-05 ENCOUNTER — Inpatient Hospital Stay (HOSPITAL_BASED_OUTPATIENT_CLINIC_OR_DEPARTMENT_OTHER): Payer: Federal, State, Local not specified - PPO | Admitting: Hematology

## 2022-05-05 VITALS — BP 117/75 | HR 66 | Temp 98.5°F | Resp 18 | Ht 68.0 in | Wt 122.7 lb

## 2022-05-05 DIAGNOSIS — C161 Malignant neoplasm of fundus of stomach: Secondary | ICD-10-CM

## 2022-05-05 DIAGNOSIS — Z85028 Personal history of other malignant neoplasm of stomach: Secondary | ICD-10-CM | POA: Insufficient documentation

## 2022-05-05 DIAGNOSIS — Z79899 Other long term (current) drug therapy: Secondary | ICD-10-CM | POA: Diagnosis not present

## 2022-05-05 DIAGNOSIS — Z903 Acquired absence of stomach [part of]: Secondary | ICD-10-CM | POA: Insufficient documentation

## 2022-05-05 DIAGNOSIS — D75839 Thrombocytosis, unspecified: Secondary | ICD-10-CM | POA: Insufficient documentation

## 2022-05-05 DIAGNOSIS — D508 Other iron deficiency anemias: Secondary | ICD-10-CM

## 2022-05-05 DIAGNOSIS — D509 Iron deficiency anemia, unspecified: Secondary | ICD-10-CM | POA: Diagnosis present

## 2022-05-05 DIAGNOSIS — E538 Deficiency of other specified B group vitamins: Secondary | ICD-10-CM | POA: Diagnosis not present

## 2022-05-05 LAB — CBC WITH DIFFERENTIAL/PLATELET
Abs Immature Granulocytes: 0.03 10*3/uL (ref 0.00–0.07)
Basophils Absolute: 0.1 10*3/uL (ref 0.0–0.1)
Basophils Relative: 1 %
Eosinophils Absolute: 0.3 10*3/uL (ref 0.0–0.5)
Eosinophils Relative: 3 %
HCT: 41.9 % (ref 36.0–46.0)
Hemoglobin: 13.9 g/dL (ref 12.0–15.0)
Immature Granulocytes: 0 %
Lymphocytes Relative: 22 %
Lymphs Abs: 2.1 10*3/uL (ref 0.7–4.0)
MCH: 31.2 pg (ref 26.0–34.0)
MCHC: 33.2 g/dL (ref 30.0–36.0)
MCV: 93.9 fL (ref 80.0–100.0)
Monocytes Absolute: 0.4 10*3/uL (ref 0.1–1.0)
Monocytes Relative: 4 %
Neutro Abs: 7 10*3/uL (ref 1.7–7.7)
Neutrophils Relative %: 70 %
Platelets: 586 10*3/uL — ABNORMAL HIGH (ref 150–400)
RBC: 4.46 MIL/uL (ref 3.87–5.11)
RDW: 13.6 % (ref 11.5–15.5)
WBC: 9.8 10*3/uL (ref 4.0–10.5)
nRBC: 0 % (ref 0.0–0.2)

## 2022-05-05 LAB — CMP (CANCER CENTER ONLY)
ALT: 13 U/L (ref 0–44)
AST: 16 U/L (ref 15–41)
Albumin: 4 g/dL (ref 3.5–5.0)
Alkaline Phosphatase: 95 U/L (ref 38–126)
Anion gap: 6 (ref 5–15)
BUN: 8 mg/dL (ref 6–20)
CO2: 27 mmol/L (ref 22–32)
Calcium: 8.8 mg/dL — ABNORMAL LOW (ref 8.9–10.3)
Chloride: 106 mmol/L (ref 98–111)
Creatinine: 0.78 mg/dL (ref 0.44–1.00)
GFR, Estimated: >60 mL/min (ref >60)
Glucose, Bld: 135 mg/dL — ABNORMAL HIGH (ref 70–99)
Potassium: 3.7 mmol/L (ref 3.5–5.1)
Sodium: 139 mmol/L (ref 135–145)
Total Bilirubin: 0.3 mg/dL (ref 0.3–1.2)
Total Protein: 6.7 g/dL (ref 6.5–8.1)

## 2022-05-05 LAB — CEA (IN HOUSE-CHCC): CEA (CHCC-In House): 5.43 ng/mL — ABNORMAL HIGH (ref 0.00–5.00)

## 2022-05-05 NOTE — Progress Notes (Signed)
Brooke Austin   Telephone:([phone]) [phone] Fax:(336) [phone]   Clinic Follow up Note   Patient Care Team: Haydee Salter, MD as PCP - General (Internal Medicine)  Date of Service:  05/05/2022  CHIEF COMPLAINT: f/u of gastric cancer and iron deficiency   CURRENT THERAPY:  1. Cancer surveillance 2. IV Venofer as needed, last in 04/2020 3. Monthly B12 injections at home  ASSESSMENT:  Brooke Austin is a 52 y.o. 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 S/p total gastrectomy due to her strong family history of gastric cancer.  -Her cancer was very early stage and, s/p complete resection and her risk for recurrence is low.  -Most recent colonoscopy/EGD 07/20/20 with [facility] showed a widely patent Schatzki's ring in distal esophagus, otherwise unremarkable and no evidence of recurrence. -last CT AP in 06/2020 also showed NED  -She is almost 5 years out from her initial diagnosis, her risk of recurrence is minimal now.  Iron deficiency anemia Secondary to poor absorption from gastrectomy and subsequent intussusception reduction. 09/2019 labs showed Hg 13.1, Ferritin 40, serum iron 135, TIBC 335, 40% transferrin saturation, and UIBC 200 -Menorrhagia may also contribute to IDA. She has had repeated EGD and colonoscopy, last in 06/2020.  -iron panel and ferritin from today are pending. CBC was not obtained, so we will send her back to lab today. -Continue home B12 injection, she is on monthly now -Continue IV iron as needed.   Thrombocytosis -Reactive, probably related to mild iron deficiency. -Previous workup of MPN genetic panel was negative.   PLAN: -lab reviewed- platelet count elevated -Continue Iv iron as needed. -Continue B12 injection monthly at home and her PCP office  -She has completed 5 years follow-up, will discharge her from my clinic.     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 The primary gastric lesion is not readily apparent on this study. 2. Borderline enlarged gastrohepatic ligament node measures 1 cm. Additionally there is a ovoid soft tissue attenuating nodule within the left periaortic retroperitoneum which is indeterminate. Periaortic adenopathy cannot be excluded. Consider further evaluation with PET-CT. 3. Tiny nonspecific nodule in the left upper lobe measures 4 mm.   07/30/2017 PET scan   PET 07/30/17 [facility] IMPRESSION: 1. No hypermetabolic mass or adenopathy to suggest metastatic disease. 2. Low level FDG uptake associated with the periaortic left retroperitoneal nodule compatible with a benign etiology. 3. 4 mm left upper lobe lung nodule is too small to characterize by PET-CT.   08/19/2017 Surgery   Total Gastrectomy 08/19/2017 at [facility]   08/19/2017 Pathology Results   FINAL PATHOLOGIC DIAGNOSIS from [facility] MICROSCOPIC EXAMINATION AND DIAGNOSIS  A.  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 The tumor invades into the lamina propria. Angiolymphatic invasion is not identified. Perineural invasion is not identified.  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       Glandular mucosa with intestinal metaplasia in the distal esophagus consistent with specialized  Barrett's mucosa; negative for dysplasia.       Multiple (49) perigastric lymph nodes are negative for tumor (0/49).       Squamous esophageal mucosa at the proximal surgical margin; negative for tumor.       Duodenal mucosa at the distal surgical margin; negative for tumor,       All surgical margins are negative for tumor (minimum tumor free margin, 7.0 cm, distal surgical margin).  B.  LYMPH NODES, HEPATIC ARTERY, EXCISION:      Multiple (8) hepatic artery lymph nodes are negative for tumor (0/8).  C.  LYMPH NODES, SPLENIC ARTERY, EXCISION:      Multiple (2) splenic artery lymph nodes are negative for    12/07/2017 Imaging   CT CAP W Constrast 12/07/17 [facility] IMPRESSION: 1. Status post gastrectomy, without residual or recurrent disease. 2. Left periaortic adenopathy, felt to be similar to the prior exam. Not FDG avid on interval PE T. Favored to be reactive. Recommend attention on follow-up. 3.  Aortic Atherosclerosis (ICD10-I70.0).  This is age advanced. 4. Tiny bilateral pulmonary nodules are unchanged, favoring a benign etiology.   12/10/2017 Mammogram   Diagnostic Mammogram 12/10/17 [facility] FINDINGS:  Mammographically, there is a group of punctate calcifications in the retroareolar right breast anterior third. Oval mass with circumscribed margins in the outer central aspect of the left breast middle third. On physical examination, there is no palpable abnormality in the left breast at the area of mammographic concern. On targeted ultrasound, there is no sonographic correlate for the mammographic finding. Incidental intramammary lymph node measuring 8 x 6 x 2 mm at the 3:00 position of the left breast 4 cm from the nipple.   02/26/2018 Imaging   CT AP W contrast 02/26/18 [facility] IMPRESSION: 1. Interval improvement or near complete resolution of the previously seen colitis involving the transverse colon. 2. Mildly thickened segment of small bowel in the left  hemipelvis concerning for enteritis. Clinical correlation is recommended. No bowel obstruction. Normal appendix. 3. Large amount of stool throughout the colon. 4. A 3.5 cm left ovarian complex/hemorrhagic cyst. Ultrasound may provide better evaluation of the pelvic structures. Probable right ovarian corpus luteum.    03/02/2018 Procedure   On 03/02/18 EGD performed for dysphagia, esophageal discomfort and abdominal pain and showed mild stricture at EG junction, dilation was performed. Otherwise it was a normal EGD.  FINAL PATHOLOGIC DIAGNOSIS ESOPHAGUS, ENDOSCOPIC BIOPSY:      MILD CHRONIC INFLAMMATION.      NO EVIDENCE OF EOSINOPHILIC ESOPHAGITIS.   03/03/2018 Imaging   MRI AP 03/03/18  [facility]  IMPRESSION: Previous gastrectomy. No evidence of recurrent or metastatic disease within the abdomen or pelvis.  Interval resolution of left ovarian hemorrhagic cyst.   06/07/2018 Imaging   06/07/2018 CT Chest Abdomen and Pelvis [facility] IMPRESSION: 1. No evidence of local tumor recurrence status post distal gastrectomy. 2. No findings suspicious for metastatic disease in the chest, abdomen or pelvis. 3. Previously questioned left para-aortic adenopathy represents a benign venous variant in this patient with a circumaortic left renal vein. 4. Previously described tiny left pulmonary nodules are stable since 07/28/2017 CT and are probably benign. 5. Aortic Atherosclerosis (ICD10-I70.0).   06/11/2018 Mammogram   Diagnostic Mammogram 06/11/18  FINDINGS:  Unchanged group of punctate calcifications in the retroareolar right breast, anterior third. Unchanged oval  equal density mass with circumscribed margins in the outer central aspect of the left breast, middle third. No new suspicious mass, distortion or calcifications in either breast.   10/04/2018 Cancer Staging   Staging form: Stomach, AJCC 8th Edition - Pathologic stage from 10/04/2018: Stage IA (pT1a, pN0, cM0) - Signed by Truitt Merle, MD on  10/04/2018      INTERVAL HISTORY:  Brooke Austin is here for a follow up of gastric cancer and iron deficiency  She was last seen by me on 05/03/2021 She presents to the clinic alone. Pt states things are going good. Pt has report she has been feeling dizziness. Pt medication has not change. Pt does still get B12 injection.    All other systems were reviewed with the patient and are negative.  MEDICAL HISTORY:  Past Medical History:  Diagnosis Date   Cancer ([place]) 2019   Gastric Stage 1A   Colitis     SURGICAL HISTORY: Past Surgical History:  Procedure Laterality Date   breast surgery     GASTRECTOMY  2019    I have reviewed the social history and family history with the patient and they are unchanged from previous note.  ALLERGIES:  is allergic to melatonin, tape, and zolpidem.  MEDICATIONS:  Current Outpatient Medications  Medication Sig Dispense Refill   baclofen (LIORESAL) 10 MG tablet Take 10 mg by mouth 3 (three) times daily as needed.     baclofen (LIORESAL) 10 MG tablet TAKE 1 TABLET BY MOUTH THREE TIMES DAILY AS NEEDED 60 tablet 2   cyanocobalamin (,VITAMIN B-12,) 1000 MCG/ML injection Inject 1 ml IM once monthly 12 mL 0   ergocalciferol (VITAMIN D2) 1.25 MG (50000 UT) capsule Take 1 capsule (50,000 Units total) by mouth once a week. 8 capsule 0   HYDROcodone-acetaminophen (NORCO/VICODIN) 5-325 MG tablet Take 1 tablet by mouth every 8 hours as needed for pain 24 tablet 0   hydrocortisone (ANUSOL-HC) 25 MG suppository Place 25 mg rectally daily as needed.     mirtazapine (REMERON) 15 MG tablet Take 1 tablet by mouth nightly 30 tablet 11   ondansetron (ZOFRAN) 8 MG tablet TAKE 1 TABLET BY MOUTH EVERY 12 HOURS AS NEEDED FOR NAUSEA 20 tablet 3   ondansetron (ZOFRAN-ODT) 4 MG disintegrating tablet Take 4 mg by mouth every 8 (eight) hours as needed for nausea or vomiting.  (Patient not taking: Reported on 09/23/2019)     PARoxetine (PAXIL) 30 MG tablet TAKE 1 TABLET BY MOUTH  ONCE DAILY 90 tablet 1   polyethylene glycol (MIRALAX / GLYCOLAX) 17 g packet Take 17 g by mouth 2 (two) times a day.     pregabalin (LYRICA) 25 MG capsule Take 2 capsules (50 mg total) by mouth 2 (two) times daily. 120 capsule 4   pregabalin (LYRICA) 50 MG capsule Take 50 mg by mouth 2 (two) times daily.     pregabalin (LYRICA) 50 MG capsule Take 50 mg by mouth 3 (three) times daily.     sertraline (ZOLOFT) 50 MG tablet Take 1 tablet (50 mg total) by mouth daily. 90 tablet 1   Syringe/Needle, Disp, (SYRINGE 3CC/23GX1") 23G X 1" 3 ML MISC 1 Syringe by Does not apply route every 30 (thirty) days. 1 each 5   temazepam (RESTORIL) 15 MG capsule Take 1 capsule (15 mg total) by mouth at bedtime. 30 capsule 0   temazepam (RESTORIL) 15 MG capsule TAKE 1 CAPSULE BY MOUTH NIGHTLY AS NEEDED FOR SLEEP 30 capsule 3   No  current facility-administered medications for this visit.    PHYSICAL EXAMINATION: ECOG PERFORMANCE STATUS: 1 - Symptomatic but completely ambulatory  Vitals:   05/05/22 1440  BP: 117/75  Pulse: 66  Resp: 18  Temp: 98.5 F (36.9 C)  SpO2: 100%   Wt Readings from Last 3 Encounters:  05/05/22 122 lb 11.2 oz (55.7 kg)  05/03/21 128 lb 9 oz (58.3 kg)  11/02/20 122 lb 9.6 oz (55.6 kg)     NECK: (-)supple, thyroid normal size, non-tender, without nodularity LYMPH: (-)  no palpable lymphadenopathy in the cervical, axillary  LUNGS: (-) clear to auscultation and percussion with normal breathing effort HEART: (-) regular rate & rhythm and no murmurs and no lower extremity edema ABDOMEN:(-) abdomen soft, non-tender and normal bowel sounds, some scar tissue.   LABORATORY DATA:  I have reviewed the data as listed    Latest Ref Rng & Units 05/05/2022    2:13 PM 05/03/2021    2:50 PM 11/02/2020    3:20 PM  CBC  WBC 4.0 - 10.5 K/uL 9.8  6.7  8.3   Hemoglobin 12.0 - 15.0 g/dL 13.9  12.4  12.9   Hematocrit 36.0 - 46.0 % 41.9  38.1  39.3   Platelets 150 - 400 K/uL 586  612  743          Latest Ref Rng & Units 05/05/2022    2:12 PM 05/03/2021    2:50 PM 11/02/2020    3:20 PM  CMP  Glucose 70 - 99 mg/dL 135  139  125   BUN 6 - 20 mg/dL 8  9  13   $ Creatinine 0.44 - 1.00 mg/dL 0.78  0.72  0.96   Sodium 135 - 145 mmol/L 139  139  140   Potassium 3.5 - 5.1 mmol/L 3.7  3.8  4.0   Chloride 98 - 111 mmol/L 106  107  105   CO2 22 - 32 mmol/L 27  28  25   $ Calcium 8.9 - 10.3 mg/dL 8.8  9.4  9.0   Total Protein 6.5 - 8.1 g/dL 6.7  6.8  7.2   Total Bilirubin 0.3 - 1.2 mg/dL 0.3  0.2  0.2   Alkaline Phos 38 - 126 U/L 95  120  118   AST 15 - 41 U/L 16  27  27   $ ALT 0 - 44 U/L 13  25  27       $ RADIOGRAPHIC STUDIES: I have personally reviewed the radiological images as listed and agreed with the findings in the report. No results found.    Orders Placed This Encounter  Procedures   Ferritin    Add to lab draw today    Standing Status:   Future    Standing Expiration Date:   05/06/2023   All questions were answered. The patient knows to call the clinic with any problems, questions or concerns. No barriers to learning was detected. The total time spent in the appointment was 20 minutes.     Truitt Merle, MD 05/05/2022   Felicity Coyer, CMA, am acting as scribe for Truitt Merle, MD.   I have reviewed the above documentation for accuracy and completeness, and I agree with the above.

## 2022-05-11 LAB — METHYLMALONIC ACID, SERUM: Methylmalonic Acid, Quantitative: 160 nmol/L (ref 0–378)
